# Patient Record
Sex: Female | Born: 1960 | Race: Black or African American | Hispanic: No | Marital: Married | Smoking: Former smoker
Health system: Southern US, Community
[De-identification: ages and names within clinical notes are randomized; demographics above are authoritative.]

## PROBLEM LIST (undated history)

## (undated) DIAGNOSIS — K219 Gastro-esophageal reflux disease without esophagitis: Secondary | ICD-10-CM

## (undated) DIAGNOSIS — R0600 Dyspnea, unspecified: Secondary | ICD-10-CM

## (undated) DIAGNOSIS — D869 Sarcoidosis, unspecified: Secondary | ICD-10-CM

## (undated) HISTORY — PX: OTHER SURGICAL HISTORY: SHX169

## (undated) HISTORY — DX: Gastro-esophageal reflux disease without esophagitis: K21.9

## (undated) HISTORY — DX: Dyspnea, unspecified: R06.00

## (undated) HISTORY — PX: LAPAROSCOPY: SHX197

## (undated) HISTORY — DX: Sarcoidosis, unspecified: D86.9

---

## 2002-09-29 ENCOUNTER — Other Ambulatory Visit: Admission: RE | Admit: 2002-09-29 | Discharge: 2002-09-29 | Payer: Self-pay | Admitting: *Deleted

## 2002-10-12 ENCOUNTER — Encounter: Payer: Self-pay | Admitting: *Deleted

## 2002-10-12 ENCOUNTER — Ambulatory Visit (HOSPITAL_COMMUNITY): Admission: RE | Admit: 2002-10-12 | Discharge: 2002-10-12 | Payer: Self-pay | Admitting: *Deleted

## 2002-11-18 ENCOUNTER — Ambulatory Visit (HOSPITAL_COMMUNITY): Admission: RE | Admit: 2002-11-18 | Discharge: 2002-11-18 | Payer: Self-pay | Admitting: *Deleted

## 2004-11-27 ENCOUNTER — Encounter: Admission: RE | Admit: 2004-11-27 | Discharge: 2004-11-27 | Payer: Self-pay | Admitting: *Deleted

## 2005-11-18 ENCOUNTER — Encounter: Admission: RE | Admit: 2005-11-18 | Discharge: 2005-11-18 | Payer: Self-pay | Admitting: Occupational Medicine

## 2007-05-21 ENCOUNTER — Ambulatory Visit: Payer: Self-pay | Admitting: Internal Medicine

## 2007-07-20 ENCOUNTER — Ambulatory Visit: Payer: Self-pay | Admitting: Internal Medicine

## 2008-06-13 ENCOUNTER — Encounter: Admission: RE | Admit: 2008-06-13 | Discharge: 2008-06-13 | Payer: Self-pay | Admitting: Internal Medicine

## 2008-07-18 DIAGNOSIS — D869 Sarcoidosis, unspecified: Secondary | ICD-10-CM | POA: Insufficient documentation

## 2008-07-18 DIAGNOSIS — R0602 Shortness of breath: Secondary | ICD-10-CM | POA: Insufficient documentation

## 2008-07-19 ENCOUNTER — Ambulatory Visit: Payer: Self-pay | Admitting: Internal Medicine

## 2008-07-19 DIAGNOSIS — K219 Gastro-esophageal reflux disease without esophagitis: Secondary | ICD-10-CM | POA: Insufficient documentation

## 2008-08-14 ENCOUNTER — Ambulatory Visit: Payer: Self-pay | Admitting: Gastroenterology

## 2008-08-21 ENCOUNTER — Ambulatory Visit: Payer: Self-pay | Admitting: Gastroenterology

## 2008-08-21 ENCOUNTER — Encounter: Payer: Self-pay | Admitting: Gastroenterology

## 2008-08-23 ENCOUNTER — Telehealth: Payer: Self-pay | Admitting: Gastroenterology

## 2008-08-23 ENCOUNTER — Encounter: Payer: Self-pay | Admitting: Gastroenterology

## 2008-08-28 ENCOUNTER — Telehealth: Payer: Self-pay | Admitting: Gastroenterology

## 2008-09-21 ENCOUNTER — Ambulatory Visit: Payer: Self-pay | Admitting: Gastroenterology

## 2008-09-21 DIAGNOSIS — K2990 Gastroduodenitis, unspecified, without bleeding: Secondary | ICD-10-CM

## 2008-09-21 DIAGNOSIS — K2970 Gastritis, unspecified, without bleeding: Secondary | ICD-10-CM | POA: Insufficient documentation

## 2008-09-28 ENCOUNTER — Telehealth: Payer: Self-pay | Admitting: Gastroenterology

## 2008-10-02 ENCOUNTER — Encounter: Payer: Self-pay | Admitting: Gastroenterology

## 2009-07-18 ENCOUNTER — Ambulatory Visit: Payer: Self-pay | Admitting: Internal Medicine

## 2009-07-19 ENCOUNTER — Encounter: Admission: RE | Admit: 2009-07-19 | Discharge: 2009-07-19 | Payer: Self-pay | Admitting: Internal Medicine

## 2010-04-10 ENCOUNTER — Telehealth: Payer: Self-pay | Admitting: Internal Medicine

## 2010-07-17 ENCOUNTER — Ambulatory Visit: Payer: Self-pay | Admitting: Internal Medicine

## 2010-10-08 NOTE — Progress Notes (Signed)
Summary: pain while breathing  Phone Note Call from Patient Call back at [phone]   Caller: Patient Call For: young Summary of Call: having pain in her chest when she breathes real deep wants to know if she can get something for this [facility] [address]  Initial call taken by: Lacinda Axon,  April 10, 2010 12:21 PM  Follow-up for Phone Call        Pt c/o dull chest pains with deep breaths x 2 days and non-productive cough. Pt states she has experienced this in the past and was given a prednisone taper and same worked well. Pt states the pain is on the "surface" of her chest. Please advise. Thanks! Zackery Barefoot CMA  April 10, 2010 12:37 PM  Allergies (verified):  1)  ! Penicillin  Additional Follow-up for Phone Call Additional follow up Details #1::        Script for prednisone 10 mg, # 20 1 tab four times daily x 2 days, 3 times daily x 2 days, 2 times daily x 2 days, 1 time daily x 2 days  but suggest she might want to try Advil for a day or two first, as more conservative than the prednisone. Additional Follow-up by: Waymon Budge MD,  April 10, 2010 12:41 PM    Additional Follow-up for Phone Call Additional follow up Details #2::    Pt informed of CY recs. Zackery Barefoot CMA  April 10, 2010 1:05 PM   New/Updated Medications: PREDNISONE 10 MG TABS (PREDNISONE) 1 tab four times daily x 2 days, 3 times daily x 2 days, 2 times daily x 2 days, 1 time daily x 2 days Prescriptions: PREDNISONE 10 MG TABS (PREDNISONE) 1 tab four times daily x 2 days, 3 times daily x 2 days, 2 times daily x 2 days, 1 time daily x 2 days  #20 x 0   Entered by:   Zackery Barefoot CMA   Authorized by:   Waymon Budge MD   Signed by:   Zackery Barefoot CMA on 04/10/2010   Method used:   Electronically to        [facility] [id number] (retail)       [address] [place], [place]  [address]       Ph: [phone]       Fax: [phone]   RxID:    [id number]

## 2010-10-08 NOTE — Assessment & Plan Note (Signed)
Summary: rov 1 yr ///kp   Primary Provider/Referring Provider:  Andi Devon, MD  CC:  Yearly follow up visit-sarcoidosis; When Deep Breathing gets SOB at times..  History of Present Illness: From 07/20/07- HISTORY:  She returns for followup saying that her level of cough is stable.  Advair makes her cough and has not seemed helpful.  She has been aware of dyspnea, relatively new in the past several months.  She has an appointment pending with Dr. Jacinto Duncan for cardiac workup.  She has not had fever or adenopathy or sputum.  IMPRESSION: 1. History of sarcoid which seems burned out. 2. 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 3. Dyspnea, is probably not pulmonary given normal function as     described above.  I am glad she is seeing Dr. Jacinto Duncan and would     wonder about echocardiogram.   07/19/08- Saw Dr Anne Duncan determined LUant chest pain was GERD. Little cough. Denies night sweats, dyspnea, nodes. Admits signif reflux. WASO strangling, not relieved by Prilosec.  July 18, 2009- cough - non prod for 2 weeks - wheezing - denies feverHx Sarcoid, GERD Feels well. Shows me small patch of grainy skin on left hand which has been present 2 weeks. Denies pruritus, fever, sweat, nodes, cough, chest pain, heartburn or malaise. ACE was wnl 21 last year CXR 11/09- interstial prom without nodes, c/w hx sarcoid.  July 17, 2010- cough - non prod for 2 weeks - wheezing - denies fever Nurse-CC: Yearly follow up visit-sarcoidosis; When Deep Breathing gets SOB at times. Minimal occasional short of breath noted lying down, may delay sleep onset. No pattern. Minor cough. Had some rash on face, hand- resolved spontaneously. Denies fever, sweat, glands. Activity unlimited.     Preventive Screening-Counseling & Management  Alcohol-Tobacco     Smoking Status: never  Current Medications (verified): 1)  Tylenol 325 Mg Tabs (Acetaminophen) .... As Needed 2)   Vitamin D 63016 Unit Caps (Ergocalciferol) .... Once Weekly 3)  Aciphex 20 Mg Tbec (Rabeprazole Sodium) .... One Tablet By Mouth Once Daily 4)  Mobic 15 Mg Tabs (Meloxicam) .... Take 1 By Mouth Once Daily  Allergies (verified): 1)  ! Penicillin  Past History:  Past Medical History: Last updated: 08/14/2008 DYSPNEA (ICD-786.05) SARCOIDOSIS (ICD-135) GERD Heart rhythm problems  Past Surgical History: Last updated: 07/18/2009 Tubal ligation and reversal Laparoscopy removal bone spurs right shoulder  Family History: Last updated: 09/12/2008 sister-asthma father-Leukemia Family History of Diabetes: 3 Mat Aunts   Mother- living age 42 Father-deceased age 19; cancer Sibling 1- living age 29; asthma Sibling 2- living age 7 Sibling 3- living age 43 Sibling 93- living age 36  Social History: Last updated: 07/19/2008 Patient never smoked.  Pt is married with no children.  No caffeine use. No ETOH.  Risk Factors: Smoking Status: never (07/17/2010)  Review of Systems      See HPI       The patient complains of shortness of breath at rest and non-productive cough.  The patient denies shortness of breath with activity, productive cough, coughing up blood, chest pain, irregular heartbeats, acid heartburn, indigestion, loss of appetite, weight change, abdominal pain, difficulty swallowing, sore throat, tooth/dental problems, headaches, nasal congestion/difficulty breathing through nose, and sneezing.    Vital Signs:  Patient profile:   50 year old female Height:      66 inches Weight:      131 pounds BMI:  21.22 O2 Sat:      98 % on Room air Pulse rate:   97 / minute BP sitting:   102 / 62  (left arm) Cuff size:   regular  Vitals Entered By: Reynaldo Minium CMA (July 17, 2010 9:37 AM)  O2 Flow:  Room air CC: Yearly follow up visit-sarcoidosis; When Deep Breathing gets SOB at times.   Physical Exam  Additional Exam:  General: A/Ox3; pleasant and cooperative,  NAD, SKIN: 2 cm diam area of rash left hand NODES: no lymphadenopathy HEENT: [place]/AT, EOM- WNL, Conjuctivae- clear, PERRLA, TM-WNL, Nose- clear, Throat- clear and wnl NECK: Supple w/ fair ROM, JVD- none, normal carotid impulses w/o bruits Thyroid-  CHEST: Clear to P&A HEART: RRR, no m/g/r heard ABDOMEN: Soft and nl; nml bowel sounds; no organomegaly or masses noted KZS:WFUX, nl pulses, no edema  NEURO: Grossly intact to observation      Impression & Recommendations:  Problem # 1:  SARCOIDOSIS (ICD-135) Clinically inactive. Discussed common symptoms to watch for.. She will foolow w/ Dr Anne Duncan and RTC here if needed.   Problem # 2:  GERD (ICD-530.81) We also reviewed potential for reflux to impact on lung, and importance of reflux precautions. Her updated medication list for this problem includes:    Aciphex 20 Mg Tbec (Rabeprazole sodium) ..... One tablet by mouth once daily  Other Orders: Est. Patient Level III (32355)  Patient Instructions: 1)  Please schedule a follow-up appointment as needed. 2)  I will be happy to see you again as needed.

## 2011-01-21 NOTE — Assessment & Plan Note (Signed)
[facility]                             PULMONARY OFFICE NOTE   Anne Duncan, Anne Duncan                         MRN:          [id number]  DATE:05/21/2007                            DOB:          [birth year]    PROBLEM:  Dyspnea with history of sarcoid.   HISTORY:  I diagnosed this 50 year old African-American woman with  sarcoidosis in 1992 by bronchoscopy after abnormal chest x-ray and she  was treated then with prednisone.  I do not have records to detail how  long she was treated.  In 2005 she was seen by Dr. Marcos Eke for problems  of cough complicated by esophageal reflux which was described as fairly  severe at that time and treated with Aciphex.  She now describes  increased shortness of breath mainly with exertion over the past two  months.  She says she also wakes at night short of breath.  She coughs  especially at night nonproductively, but with a sense of chest tightness  and occasional palpitation.  She says night sweats are chronic and  stable.  Advair has helped her.   MEDICATIONS:  1. Vitamin D.  2. Advair 100/50 b.i.d.  3. Tylenol p.r.n.   ALLERGIES:  PENICILLIN.   She no longer takes Aciphex or any acid blocker.   REVIEW OF SYSTEMS:  Occasionally feels numbness in right arm and right  leg.  No real pattern.  Actually more of a chest tightness.  Occasional  palpitation.  Acid indigestion.  Headaches.  Joint stiffness.  Weight  has been stable.  She denies adenopathy, blood, or rash.   PAST MEDICAL HISTORY:  Heart rhythm problems.  Sarcoid treated with  prednisone.  Tubal ligation and reversal.  She denies being pregnant.  No exposure to tuberculosis.   SOCIAL HISTORY:  Never smoked.  Married.  Working as a Water engineer  at [employer].  No children.   FAMILY HISTORY:  She denies anybody with respiratory problems or  sarcoid.   OBJECTIVE:  VITAL SIGNS:  Weight 118 pounds, blood pressure 106/70,  pulse 76, room air  saturation 100%.  GENERAL:  This is a slim woman in no evident distress.  SKIN:  Without obvious rash.  No adenopathy found.  HEENT:  Conjunctivae and oral mucosa clear.  Nose is not obstructed.  CHEST:  Quiet, clear breath sounds.  No wheeze, rales, or rhonchi.  HEART:  Sounds are regular without murmur or gallop.  ABDOMEN:  Without hepatosplenomegaly.  EXTREMITIES:  Without cyanosis or clubbing.   IMPRESSION:  Remote history of sarcoid diagnosed 16 years ago and  treated then with prednisone.  Now with constitutional symptoms which  might reflect reactivation of sarcoid or something else.   PLAN:  1. Blood for ACE level.  2. Chest x-ray.  3. Pulmonary function tests.  4. She will continue her Advair until she returns.   I appreciate the chance to see her again and we certainly will try to  find what is making her uncomfortable.     Clinton D. Maple Hudson, MD, FCCP, FACP  Electronically Signed  CDY/MedQ  DD: 05/23/2007  DT: 05/23/2007  Job #: [id number]   cc:   Merlene Laughter. Renae Gloss, M.D.

## 2011-01-21 NOTE — Assessment & Plan Note (Signed)
[facility]                             PULMONARY OFFICE NOTE   NAME:CATOELadye, Macnaughton                         MRN:          [id number]  DATE:07/20/2007                            DOB:          [birth year]    1. History of sarcoidosis.  2. Dyspnea.   HISTORY:  She returns for followup saying that her level of cough is  stable.  Advair makes her cough and has not seemed helpful.  She has  been aware of dyspnea, relatively new in the past several months.  She  has an appointment pending with Dr. Jacinto Halim for cardiac workup.  She has  not had fever or adenopathy or sputum.   MEDICATIONS:  1. Vitamin D.  2. Advair 100/50.  3. Occasional Tylenol.   DRUG INTOLERANT PENICILLIN.   OBJECTIVE:  Weight 122 pounds, blood pressure 114/74, pulse 82, room air  saturation 100%.  Dry cough, no distress, slender.  I find no  adenopathy, neck vein distention, postnasal drip or stridor.  HEART:  Sounds are regular without murmur heard.   Chest x-ray September 12 described stable exam with diffuse interstitial  prominence, not changed since January 2004.  Hilar prominence stable,  these are consistent with previous sarcoid.  Pulmonary function testing  today showed mild restriction with a total lung capacity 75% of  predicted.  Flows were normal for volume with no response to  bronchodilator.  Diffusion was normal.  On a 6-minute walk test she went  402 meters.  She was transiently dizzy on completing the exercise.  Blood pressure rose from 114/74 to 116/78 and 2 minutes later was  110/72, heart rate rose from 82 to 92 and two minutes later was 90,  oxygen saturation stayed 100% - 99% during the exercise and two minutes  after ending was 97%.  Normal cardiopulmonary function.  Exercise limit  was not reached with this exercise.   IMPRESSION:  1. History of sarcoid which seems burned out.  2. Dry cough is nonspecific but could reflect sarcoid granulomas in      the  airways.  3. Dyspnea, is probably not pulmonary given normal function as      described above.  I am glad she is seeing Dr. Jacinto Halim and would      wonder about echocardiogram.   PLAN:  1. Since today's PFTs were obtained after she had taken Advair we      recognized possibility that at times she may be worse.  She is      going to try stopping Advair for 1 week and then restarting to see      if she can tell any benefit.  2. Agree with cardiac workup for dyspnea including echocardiogram.  3. She is encouraged to walk for aerobic exercise.  4. Flu vaccine.  5. Schedule return 1 year, but earlier p.r.n.     Clinton D. Maple Hudson, MD, Tonny Bollman, FACP  Electronically Signed    CDY/MedQ  DD: 07/20/2007  DT: 07/21/2007  Job #: [id number]   cc:   Merlene Laughter. Renae Gloss, M.D.  Cristy Hilts.  Jacinto Halim, MD

## 2011-01-24 NOTE — H&P (Signed)
   NAME:  Anne Duncan, Anne Duncan                             ACCOUNT NO.:  [id number]   MEDICAL RECORD NO.:  [id number]                   PATIENT TYPE:  AMB   LOCATION:  [facility]                                  FACILITY:  [facility]   PHYSICIAN:  [place] B. Earlene Plater, M.D.               DATE OF BIRTH:  March 02, 1961   DATE OF ADMISSION:  DATE OF DISCHARGE:                                HISTORY & PHYSICAL   CHIEF COMPLAINT:  Infertility and hydrosalpinx.   INTENDED PROCEDURE:  Operative laparoscopy, left salpingectomy, possible  right fimbrioplasty, and lysis of adhesions.   HISTORY OF PRESENT ILLNESS:  A 50 year old African-American female gravida 0  with long history of infertility.  A recent HSG showed a left hydrosalpinx  and right tubal blockage in the isthmic portion.  Has previously used Clomid  for two to three rounds and has had no other identified infertility factors.  Is interested in operative management to optimize chances at spontaneous  conception or in vitro fertilization.   PAST MEDICAL HISTORY:  Chicken pox.  No known history of PID or cervicitis.   SURGICAL HISTORY:  None.   MEDICATIONS:  Multivitamin.   ALLERGIES:  PENICILLIN causes hives.   SOCIAL HISTORY:  No alcohol, tobacco, or other drugs.  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   FAMILY HISTORY:  Hypertension and a maternal uncle and father with leukemia.   REVIEW OF SYSTEMS:  Otherwise significant only for heartburn.   PHYSICAL EXAMINATION:  VITAL SIGNS:  Blood pressure 100/70, weight 115,  height 5 feet 5 inches.  GENERAL:  Alert and oriented, no acute distress.  SKIN:  Warm and dry, no lesions.  NECK:  Supple.  No thyromegaly.  HEART:  Regular rate and rhythm.  LUNGS:  Clear to auscultation.  ABDOMEN:  Liver and spleen normal, no hernia.  LYMPH NODE SURVEY:  Negative at neck, axilla, and groin.  BREASTS:  No dominant masses, nipple discharge, or adenopathy.  PELVIC:  Normal external  genitalia, vagina and cervix normal.  Recent Pap  normal.  No adnexal masses palpable.   ASSESSMENT:  Infertility, tubal factor.    PLAN:  Left salpingectomy, possible right tuboplasty, lysis of adhesions,  treatment of any other pelvic factors identified that might contribute to  infertility.  Operative risks discussed including infection, bleeding,  damage to bowel, bladder, or surrounding organs.  All questions answered.  The patient wishes to proceed.                                               Gerri Spore B. Earlene Plater, M.D.    WBD/MEDQ  D:  11/14/2002  T:  11/14/2002  Job:  [id number]

## 2011-01-24 NOTE — Op Note (Signed)
NAME:  Anne Duncan, Anne Duncan                             ACCOUNT NO.:  [id number]   MEDICAL RECORD NO.:  [id number]                   PATIENT TYPE:  AMB   LOCATION:  [facility]                                  FACILITY:  [facility]   PHYSICIAN:  [place] B. Earlene Plater, M.D.               DATE OF BIRTH:  Dec 16, 1960   DATE OF PROCEDURE:  11/18/2002  DATE OF DISCHARGE:                                 OPERATIVE REPORT   PREOPERATIVE DIAGNOSES:  1. Infertility.  2. Left hydrosalpinx.   POSTOPERATIVE DIAGNOSES:  1. Infertility.  2. Left and right hydrosalpinx.   PROCEDURES:  1. Open laparoscopy.  2. Lysis of adhesions.   SURGEON:  Chester Holstein. Earlene Plater, M.D.   ANESTHESIA:  General.   FINDINGS:  Right hydrosalpinx with dense adhesions to the right ovary.  Left  hydrosalpinx with dense adhesions to the sigmoid colon.  Neither of these  were deemed resectable through the laparoscope.  One 3 mm questionable  endometriosis implant along the left uterosacral ligament, filmy adhesions  within the cul-de-sac posteriorly.  Normal-appearing upper abdomen.  Jejunal  adhesions to the right pelvic sidewall.   ESTIMATED BLOOD LOSS:  50 mL.   FLUIDS REPLACED:  1200 mL.   URINE OUTPUT:  100 mL.   COMPLICATIONS:  None.   INDICATIONS:  Patient with a history of infertility, hysterosalpingogram  preoperatively indicating left hydrosalpinx and right tubal obstruction.  Patient with no history of PID or endometriosis.   DESCRIPTION OF PROCEDURE:  The patient was taken to the operating room and  general anesthesia obtained.  She was placed in the ski position and prepped  and draped in the standard fashion.  A Foley catheter inserted into the  bladder and the Corson cannula inserted after a single-tooth tenaculum  attached to the cervix.   A vertical incision made with a knife at the umbilicus and carried sharply  through the fascia.  A pursestring suture of 0 Vicryl placed around the  fascial defect, Hasson cannula inserted  and secured.  Pneumoperitoneum  obtained with CO2 gas, Trendelenburg position obtained.   Five millimeter trocars placed 2 cm above the symphysis in the midline and  the left lower quadrant, each under direct laparoscopic visualization.  Bowel mobilized superiorly.  The above findings were noted.   The right tube and ovary were densely adherent to one another to the point  that the right hydrosalpinx could not be resected without removal of the  ovary.  Therefore, it was left in place.   On the left side the left tube was densely adherent to the sigmoid colon and  for similar reasons was left in situ.  In order to visualize the left tube,  the adhesions from the sigmoid colon to the left pelvic sidewall were taken  down sharply.  At that point it was clear that neither tube could be safely  treated through the laparoscope,  and therefore the procedure was terminated.   The inferior ports were removed and their sites hemostatic.  The scope  removed, the pressure released, the Hasson cannula removed, the abdominal  wall elevated with the [employer] retractor, and the fascial defect closed.  No intra-abdominal contents herniated through prior to closure.  Skin closed  at each site with subcuticular 4-0 Vicryl.  On closing the umbilicus, the  initial fascial suture was cut.  Therefore, the abdominal wall was re-  elevated with Kocher clamps and pursestring suture replaced around the  fascial defect.  While the abdominal wall was elevated, the suture was  snugged down.  This obliterated the fascial defect, and no intra-abdominal  contents herniated through prior to closure.  The skin was then re-closed at  the umbilicus with 4-0 Vicryl in subcuticular fashion.  Inferior ports  closed in similar fashion.   The patient tolerated the procedure well.  There were no complications.  She  was taken to the recovery room awake, alert, and in stable condition.                                                Gerri Spore B. Earlene Plater, M.D.    WBD/MEDQ  D:  11/18/2002  T:  11/18/2002  Job:  [id number]

## 2011-02-28 ENCOUNTER — Encounter (HOSPITAL_COMMUNITY)
Admission: RE | Admit: 2011-02-28 | Discharge: 2011-02-28 | Disposition: A | Discharge status: Home / Self Care | Payer: BC Managed Care – PPO | Source: Ambulatory Visit | Attending: Obstetrics and Gynecology | Admitting: Obstetrics and Gynecology

## 2011-02-28 LAB — CBC
HCT: 37.3 % (ref 36.0–46.0)
MCHC: 32.4 g/dL (ref 30.0–36.0)
MCV: 89.2 fL (ref 78.0–100.0)
RDW: 13 % (ref 11.5–15.5)

## 2011-03-07 ENCOUNTER — Other Ambulatory Visit: Payer: Self-pay | Admitting: Obstetrics and Gynecology

## 2011-03-07 ENCOUNTER — Ambulatory Visit (HOSPITAL_COMMUNITY)
Admission: EM | Admit: 2011-03-07 | Discharge: 2011-03-07 | Disposition: A | Discharge status: Home / Self Care | Payer: BC Managed Care – PPO | Source: Ambulatory Visit | Attending: Obstetrics and Gynecology | Admitting: Obstetrics and Gynecology

## 2011-03-07 DIAGNOSIS — Z01812 Encounter for preprocedural laboratory examination: Secondary | ICD-10-CM | POA: Insufficient documentation

## 2011-03-07 DIAGNOSIS — N831 Corpus luteum cyst of ovary, unspecified side: Secondary | ICD-10-CM | POA: Insufficient documentation

## 2011-03-07 DIAGNOSIS — N83209 Unspecified ovarian cyst, unspecified side: Secondary | ICD-10-CM | POA: Insufficient documentation

## 2011-03-07 DIAGNOSIS — N920 Excessive and frequent menstruation with regular cycle: Secondary | ICD-10-CM | POA: Insufficient documentation

## 2011-03-07 DIAGNOSIS — N840 Polyp of corpus uteri: Secondary | ICD-10-CM | POA: Insufficient documentation

## 2011-03-07 DIAGNOSIS — Z01818 Encounter for other preprocedural examination: Secondary | ICD-10-CM | POA: Insufficient documentation

## 2011-03-12 NOTE — Consult Note (Signed)
NAMEALVARETTA, EISENBERGER                ACCOUNT NO.:  [id number]  MEDICAL RECORD NO.:  [id number]  LOCATION:  [facility]                          [facility]  PHYSICIAN:  Quran Vasco A. Yeray Tomas, M.D. DATE OF BIRTH:  May 03, 1961  DATE OF CONSULTATION: DATE OF DISCHARGE:                                CONSULTATION   PREOPERATIVE DIAGNOSIS:  Persistent complex left ovarian cyst menorrhagia.  POSTOPERATIVE DIAGNOSES:  Persistent complex left ovarian cyst menorrhagia with right ovarian cyst, dense pelvic adhesions, endometriosis or stigmata consistent with endometriosis, and endometrial polyp.  PROCEDURES:  Laparoscopic bilateral ovarian cystectomies, lysis of adhesions, peritoneal biopsy, pelvic washings, dilation and curettage, hysteroscopy, and polypectomy.  SURGEON:  Debraann Livingstone A. Normand Sloop, MD  ASSISTANT:  Marquis Lunch. Adline Peals.  ANESTHESIA:  General and local.  FINDINGS:  Small polyp near the right cornea of the uterus, otherwise atrophic-appearing endometrium, and no submucosal fibroids.  The findings abdominally are bilateral ovarian cysts, one on the right, one on the left, one on the patient's left side was about 3-4 cm, one on the right side was a good 6 cm.  What we sent to pathology was right and left ovarian cyst walls and posterior peritoneal biopsy or sidewall biopsy and pelvic washings.  ESTIMATED BLOOD LOSS:  75 mL.  URINE OUTPUT:  100 mL.  IV FLUIDS:  2000 mL of crystalloid.  There were no complications.  The patient went to recovery in stable condition.  PROCEDURE IN DETAIL:  The patient was taken to the operating room, given general anesthesia, placed in dorsal lithotomy position, and prepped and draped in the normal sterile fashion.  A bivalve speculum was placed into the vagina.  The anterior lip of the cervix was grasped with a single tooth tenaculum, 20 mL of 1% Xylocaine was used for cervical block.  Cervix was then dilated with Shawnie Pons dilators of 21.   The hysteroscope was placed into the uterine cavity.  One polyp was seen near the right cornu.  Both ostia were seen.  No submucosal fibroids or the polyps were seen.  The cervix further dilated with Pratt dilators up to 31.  The resectoscope was placed and not using any heat, just the loop I have removed the polyp.  I then did a D and C and endometrial curettings.  I then looked again and there were no signs of perforation. The polyp was removed in its entirety.  The hysteroscope was then removed from the uterus and cervix.  The acorn manipulator was placed into the cervix and attached to the tenaculum already in place. Attention was then turned to the umbilicus where Marcaine was placed around the umbilicus for general anesthesia.  A 10-mm infraumbilical incision was made with a scalpel and carried down to the fascia.  Fascia was then incised in midline, extended bilateral with Mayo scissors.  The peritoneum was identified, tented up, and entered bluntly.  The fascia was circumscribed with 0 Vicryl and the Hasson was placed into the abdominal cavity and the balloon was insufflated.  Upon inspection, the abdominal anatomy appeared to be normal.  Liver was normal.  Appendix was normal.  The patient had left ovarian, bowel, and sidewall adhesions.  She had bilateral hydrosalpinges.  She had a left ovarian cyst consistent with endometrioma.  She has a small fibroid anteriorly on the uterus and the anterior cul-de-sac though was clear.  The posterior cul-de-sac with filmy adhesions and along the left side wall with powder burn lesion consistent with endometriosis.  On the right side again, bilateral hydrosalpinx, we could not to see the fimbria clear.  It was about a 6 cm ovarian cyst on that side.  So, attention was turned to the left side first where the adhesions were both sharply and bluntly dissected from the bowel and the tube and ovary.  There was no injury that could occur to the  bowel, the tube, and ovary.  Also, the bowel was adherent to the left side wall, and these adhesions were also lysed.  The ovary could now be seen and using the monopolar scissors, a small linear incision was made.  The cyst was identified.  The cyst did pop and there was blood seen in the cyst wall.  The cyst wall was then removed from the ovary and any bleeding areas were made hemostatic using Kleppinger.  Aeration was then done.  Hemostasis was assured.  Attention was then turned to the left sidewall.  The peritoneum was insufflated with hydrodissection with water and little stigmata of endometriosis was removed.  There was some bleeding.  I tried to leave it alone for some time.  We tried to locate the ureter, but could not see it.  I did infiltrate or open the peritoneum and could not see the ureter underneath, so I did use Kleppinger to burn that area.  Hemostasis was assured and no injury occurred to any pelvic wall structures.  In the posterior cul-de-sac, there were filmy adhesions, which were lysed and removed.  Then, the patient's right utero-ovarian ligament was grasped to mobilize the ovary.  A linear incision was made with the unipolar scissors and then opened the cyst, and it was clear straw fluid.  The cyst wall was removed with traction and countertraction without difficulty.  The ovarian bed was made hemostatic with Kleppinger without difficulty.  Irrigation was done.  Hemostasis was assured.  There was a little bit of bleeding along where we did the dissection on the left side from the __________ to the tube and the bowel was right underneath this fatty area, did not want to use any heat, so what we did was just hold pressure and used Gelfoam and let the air out of the abdomen and re- insufflated and was hemostatic.  All instruments at this point were removed from the abdomen under direct visualization of the laparoscope. The fascia was reapproximated at the umbilicus.   The skin was closed with 3-0 Monocryl in a subcuticular fashion.  As she requested in the beginning of the procedure, two small incisions were made with the needle in the right and left lower quadrants and two 5-mm trocars were placed under direct visualization of the laparoscope, so these were removed.  Hemostasis was assured.  Dermabond was placed.  I then removed the tenaculum and the acorn.  She had some bleeding from tenaculum site on the cervix, which was made hemostatic with Bovie cautery.  I removed the Foley, which was placed before even the hysteroscopy was done. Sponge, lap, and needle counts were correct.  The patient was sent to recovery in stable condition.     Robbyn Hodkinson A. Normand Sloop, M.D.     NAD/MEDQ  D:  03/07/2011  T:  03/08/2011  Job:  [id number]  Electronically Signed by Jaymes Graff M.D. on 03/12/2011 01:06:03 PM

## 2011-03-17 NOTE — H&P (Signed)
NAMEYURITZA, PAULHUS                ACCOUNT NO.:  [id number]  MEDICAL RECORD NO.:  [id number]  LOCATION:  [facility]                          [facility]  PHYSICIAN:  Naima A. Dillard, M.D. DATE OF BIRTH:  November 12, 1960  DATE OF ADMISSION:  03/07/2011 DATE OF DISCHARGE:  03/07/2011                             HISTORY & PHYSICAL   HISTORY OF PRESENT ILLNESS:  Anne Duncan is a 50 year old married black female, gravida 0 presenting for hysteroscopy, D&C with resection of an endometrial polyp along with laparoscopic ovarian cystectomy because of menorrhagia, symptomatic uterine fibroids, endometrial polyp, and a complex ovarian cyst.  Anne Duncan was amenorrheic for 6 months and then for 5 consecutive months, had irregular menstrual period.  Following that time, she experiences 7-day menstrual flow requiring her to change the tampon every 1-2 hours along with severe cramping that radiated to her back and legs and accompanied by nausea.  The patient underwent an endometrial biopsy in May 2012 that showed secretory endometrium with no hyperplasia, atypia, or malignancy.  A pelvic ultrasound in May 2012 showed a uterus measuring 7.84 x 4.98 x 3.10 cm and a sonohysterogram with saline infusion showing an endometrial mass measuring 0.90 x 0.65 x 0.72 cm.  Additionally, the patient was found to have a right ovarian cyst measuring 5.44 x 3.42 x 4.98 cm and a right lateral subserosal fibroid measuring 2.14 x 2.07 x 2.42 cm.  There was no free fluid noted and no pelvic masses seen.  The patient's left ovary measured 2.64 x 4.06 x 1.87 cm and right ovary 5.44 x 4.98 x 3.42 cm.  Given the patient's ultrasound findings and her corresponding symptomatology, she has consented to proceed with surgical management.  PAST MEDICAL HISTORY:  OB history:  Gravida 0.   GYN history:  Menarche 50 years old.  Her last menstrual period was October 20, 2010.  Shedoes not use any contraception.  Denies any history of  sexually transmitted diseases or abnormal Pap smears.  Her last normal Pap smear was November 2011.    Medical history is positive for chronic shoulder pain.  SURGICAL HISTORY:  In 2005, she underwent laparoscopy for pelvic pain.  FAMILY HISTORY:  Positive for asthma and hypertension.  SOCIAL HISTORY:  The patient is married and she works as a Mining engineer. Habits:  She does not use alcohol, tobacco, or illicit drugs.  CURRENT MEDICATIONS:  Vitamin D.  She is allergic to PENICILLIN.  REVIEW OF SYSTEMS:  Negative except as is mentioned in history of present illness.  PHYSICAL EXAMINATION:  VITAL SIGNS:  Blood pressure 110/70, weight 133 pounds, height 5 feet 6-1/2 inches tall, body mass index 21. NECK:  Supple without masses.  There is no thyromegaly or cervical adenopathy. HEART:  Regular rate and rhythm. LUNGS:  Clear. BACK:  No CVA tenderness. ABDOMEN:  Without tenderness, guarding, rebound, or organomegaly. PELVIC:  EGBUS is normal.  Vagina is normal.  Cervix is nontender without lesions.  Uterus appears normal size, shape, and consistency. Adnexa without tenderness or masses.  IMPRESSION: 1. Menorrhagia. 2. Persistent right ovarian cyst (complex).  DISPOSITION:  A discussion was held with the patient regarding the indications for her  procedures along with their risks which include but are not limited to reaction to anesthesia, damage to adjacent organs, infection, excessive bleeding, and endometrial scarring.  The patient verbalized understanding of these risk and consented to proceed with an operative laparoscopy with ovarian cystectomy, hysteroscopy, D and C with resection of endometrial polyp at [facility] on March 07, 2011.     Anne Duncan, P.A.-C   ______________________________ Anne Duncan, M.D.    EJP/MEDQ  D:  03/17/2011  T:  03/17/2011  Job:  [id number]

## 2011-04-18 ENCOUNTER — Other Ambulatory Visit: Payer: Self-pay | Admitting: Internal Medicine

## 2011-04-18 DIAGNOSIS — Z1231 Encounter for screening mammogram for malignant neoplasm of breast: Secondary | ICD-10-CM

## 2011-04-23 ENCOUNTER — Ambulatory Visit
Admission: RE | Admit: 2011-04-23 | Discharge: 2011-04-23 | Disposition: A | Discharge status: Home / Self Care | Payer: BC Managed Care – PPO | Source: Ambulatory Visit | Attending: Internal Medicine | Admitting: Internal Medicine

## 2011-04-23 DIAGNOSIS — Z1231 Encounter for screening mammogram for malignant neoplasm of breast: Secondary | ICD-10-CM

## 2011-04-26 ENCOUNTER — Ambulatory Visit (INDEPENDENT_AMBULATORY_CARE_PROVIDER_SITE_OTHER): Payer: BC Managed Care – PPO

## 2011-04-26 ENCOUNTER — Inpatient Hospital Stay (INDEPENDENT_AMBULATORY_CARE_PROVIDER_SITE_OTHER)
Admission: RE | Admit: 2011-04-26 | Discharge: 2011-04-26 | Disposition: A | Discharge status: Home / Self Care | Payer: BC Managed Care – PPO | Source: Ambulatory Visit | Attending: Emergency Medicine | Admitting: Emergency Medicine

## 2011-04-26 DIAGNOSIS — R1013 Epigastric pain: Secondary | ICD-10-CM

## 2011-04-26 DIAGNOSIS — R51 Headache: Secondary | ICD-10-CM

## 2011-04-26 LAB — CBC
MCH: 28.4 pg (ref 26.0–34.0)
MCV: 86.5 fL (ref 78.0–100.0)
Platelets: 295 10*3/uL (ref 150–400)
RBC: 4.29 MIL/uL (ref 3.87–5.11)

## 2011-04-26 LAB — POCT I-STAT, CHEM 8
BUN: 6 mg/dL (ref 6–23)
Calcium, Ion: 1.15 mmol/L (ref 1.12–1.32)
Chloride: 105 mEq/L (ref 96–112)
Creatinine, Ser: 0.9 mg/dL (ref 0.50–1.10)
Glucose, Bld: 100 mg/dL — ABNORMAL HIGH (ref 70–99)

## 2011-04-26 LAB — DIFFERENTIAL
Eosinophils Absolute: 0 10*3/uL (ref 0.0–0.7)
Eosinophils Relative: 0 % (ref 0–5)
Lymphs Abs: 2.8 10*3/uL (ref 0.7–4.0)
Monocytes Relative: 6 % (ref 3–12)
Neutrophils Relative %: 50 % (ref 43–77)

## 2011-05-02 ENCOUNTER — Ambulatory Visit (INDEPENDENT_AMBULATORY_CARE_PROVIDER_SITE_OTHER): Payer: BC Managed Care – PPO | Admitting: Internal Medicine

## 2011-05-02 ENCOUNTER — Encounter: Payer: Self-pay | Admitting: Internal Medicine

## 2011-05-02 VITALS — BP 110/64 | HR 69 | Ht 66.0 in | Wt 133.8 lb

## 2011-05-02 DIAGNOSIS — D869 Sarcoidosis, unspecified: Secondary | ICD-10-CM

## 2011-05-02 DIAGNOSIS — K219 Gastro-esophageal reflux disease without esophagitis: Secondary | ICD-10-CM

## 2011-05-02 NOTE — Progress Notes (Signed)
Subjective:    Patient ID: Anne Duncan, female    DOB: 03-17-1961, 50 y.o.   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  Now 1 week ago as she got up for work, she noted substernal pressure sense without wheeze, dyspnea, or GI symptoms. This lasted through the day, worse with acitivity, walking. Next day had some fever and chillling that faded by night. She went to Urgent Care - CXR "spot", bloodwork "fine". Gave tylenol w/ codiene -relieved discomfort. Gave prilosec for ? reason- made her nauseated.  CXR- 04/26/11- diffuse increased markings c/w sarcoid, stable RUL nodule c/w 2009.  She now feels well.  We reviewed CXR images together back to 2008. Interstitial markings may be a little denser. We think we can see the peripheral RUL nodule back that far, obscured by rib.    Review of Systems Constitutional:   No- current  weight loss, night sweats, fevers, chills, fatigue, lassitude. HEENT:   No-  headaches, difficulty swallowing, tooth/dental problems, sore throat,       No-  sneezing, itching, ear ache, nasal congestion, post nasal drip,  CV:  No-   chest pain, orthopnea, PND, swelling in lower extremities, anasarca, dizziness, palpitations Resp: No-   shortness of breath with exertion or at rest.              No-   productive cough,  No non-productive cough,  No-  coughing up of blood.              No-   change in color of mucus.  No- wheezing.   Skin: No-   rash or lesions. GI:  No-   heartburn, indigestion, abdominal pain, nausea, vomiting, diarrhea,                 change in bowel habits, loss of appetite GU: No-   dysuria, change in color of urine, no urgency or frequency.  No- flank pain. MS:  No-   joint pain or swelling.  No- decreased range of motion.  No- back pain. Neuro- grossly normal to observation, Or:  Psych:  No- change in mood or  affect. No depression or anxiety.  No memory loss.     Objective:   Physical Exam General- Alert, Oriented, Affect-appropriate, Distress- none acute  WDWN Skin- rash-none, lesions- none, excoriation- none Lymphadenopathy- none Head- atraumatic            Eyes- Gross vision intact, PERRLA, conjunctivae clear secretions            Ears- Hearing, canals normal            Nose- Clear, no-Septal dev, mucus, polyps, erosion, perforation   Sniffing lightly            Throat- Mallampati III , mucosa clear , drainage- none, tonsils- atrophic Neck- flexible , trachea midline, no stridor , thyroid nl, carotid no bruit Chest - symmetrical excursion , unlabored           Heart/CV- RRR , no murmur , no gallop  , no rub, nl s1 s2                           - JVD- none , edema- none, stasis changes- none, varices- none           Lung- clear to  P&A, wheeze- none, cough- none , dullness-none, rub- none           Chest wall-  Abd- tender-no, distended-no, bowel sounds-present, HSM- no Br/ Gen/ Rectal- Not done, not indicated Extrem- cyanosis- none, clubbing, none, atrophy- none, strength- nl Neuro- grossly intact to observation         Assessment & Plan:

## 2011-05-02 NOTE — Patient Instructions (Signed)
Please call as needed 

## 2011-05-02 NOTE — Assessment & Plan Note (Signed)
Recent transient bronchitis syndrome, but now feels well. She is satisfied and we have no evidence of ongoing process so we will see her back prn. Considered ACE level and decided to defer for now.

## 2011-05-06 NOTE — Assessment & Plan Note (Signed)
Reflux precautions were reviewed. We discussed the possibility that her substernal discomfort was esophagitis as this has resolved she will follow

## 2011-08-11 ENCOUNTER — Telehealth: Payer: Self-pay | Admitting: Internal Medicine

## 2011-08-11 MED ORDER — PREDNISONE 10 MG PO TABS: ORAL_TABLET | ORAL | Status: DC

## 2011-08-11 NOTE — Telephone Encounter (Signed)
Per CY-for wheeze cough chest tight offer her prednisone 10mg  #20 take 4 x 2 days, 3 x 2 days, 2 x 2 days, 1 x 2 days no refills.

## 2011-08-11 NOTE — Telephone Encounter (Signed)
I spoke with pt and is aware of cdy recs. Pt aware also of rx directions and rx was sent to pharmacy. Pt voiced hr understanding and had no question

## 2011-08-11 NOTE — Telephone Encounter (Signed)
Spoke with pt. She is c/o SOB and non prod cough x 2 days. She states that her chest feels tight also. She states that she was wheezing some last night and this am. No fever. Would like something called to pharm. Please advise, thanks! Allergies  Allergen Reactions  . Penicillins     REACTION: intolerant

## 2012-06-07 ENCOUNTER — Other Ambulatory Visit: Payer: Self-pay | Admitting: Internal Medicine

## 2012-06-07 DIAGNOSIS — Z1231 Encounter for screening mammogram for malignant neoplasm of breast: Secondary | ICD-10-CM

## 2012-07-05 ENCOUNTER — Encounter (HOSPITAL_COMMUNITY): Payer: Self-pay | Admitting: *Deleted

## 2012-07-05 ENCOUNTER — Emergency Department (HOSPITAL_COMMUNITY)
Admission: EM | Admit: 2012-07-05 | Discharge: 2012-07-05 | Disposition: A | Discharge status: Home / Self Care | Payer: BC Managed Care – PPO | Source: Home / Self Care

## 2012-07-05 DIAGNOSIS — M940 Chondrocostal junction syndrome [Tietze]: Secondary | ICD-10-CM

## 2012-07-05 MED ORDER — KETOROLAC TROMETHAMINE 60 MG/2ML IM SOLN
60.0000 mg | Freq: Once | INTRAMUSCULAR | Status: AC
  Administered 2012-07-05: 60 mg via INTRAMUSCULAR

## 2012-07-05 MED ORDER — NAPROXEN 500 MG PO TBEC: 500.0000 mg | DELAYED_RELEASE_TABLET | Freq: Two times a day (BID) | ORAL | Status: DC

## 2012-07-05 MED ORDER — KETOROLAC TROMETHAMINE 60 MG/2ML IM SOLN
INTRAMUSCULAR | Status: AC
  Filled 2012-07-05: qty 2

## 2012-07-05 NOTE — ED Provider Notes (Signed)
History     CSN: [id number]  Arrival date & time 07/05/12  0805   None     Chief Complaint  Patient presents with  . Flank Pain    (Consider location/radiation/quality/duration/timing/severity/associated sxs/prior treatment) HPI Comments: 51 year old female who is complaining of right chest wall pain for one week. Pain is located along the ribs beneath the breast including the xiphoid and right lateral ribs. She states the pain is worse with movement rotation and twisting of the torso and lying down the right side. It also hurts with a deep breath. She denies fever chills shortness of breath or cough. She also denies soft tissue pain or tenderness of the breast. She denies any known injury or event that would've caused the pain. 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 She denies heaviness, tightness, fullness, pressure, or squeezing discomfort of the chest. Denies diaphoresis or GI symptoms.   Past Medical History  Diagnosis Date  . Dyspnea   . Sarcoidosis   . GERD (gastroesophageal reflux disease)     Past Surgical History  Procedure Date  . Tubal ligation and reversal   . Laparoscopy   . Removal bone spurs right shoulder     History reviewed. No pertinent family history.  History  Substance Use Topics  . Smoking status: Never Smoker   . Smokeless tobacco: Not on file  . Alcohol Use: No    OB History    Grav Para Term Preterm Abortions TAB SAB Ect Mult Living                  Review of Systems  Constitutional: Negative for fever, chills and activity change.  HENT: Negative.   Respiratory: Negative.  Negative for chest tightness, shortness of breath and wheezing.   Cardiovascular: Negative.   Genitourinary: Negative for dysuria, frequency, flank pain and pelvic pain.  Musculoskeletal: Negative for joint swelling and gait problem.       As per HPI  Skin: Negative for color change, pallor and rash.  Neurological: Negative.    Psychiatric/Behavioral: Negative.     Allergies  Penicillins  Home Medications   Current Outpatient Rx  Name Route Sig Dispense Refill  . ERGOCALCIFEROL 50000 UNITS PO CAPS Oral Take 50,000 Units by mouth once a week.      Marland Kitchen NAPROXEN 500 MG PO TBEC Oral Take 1 tablet (500 mg total) by mouth 2 (two) times daily with a meal. 24 tablet 0  . PREDNISONE 10 MG PO TABS  Take 4 tablets x 2 days, 3 tablets x 2 days, 2 tablets x 2 days, 1 tablet x 2 days, then stop 20 tablet 0    BP 138/0  Pulse 72  Temp 98.6 F (37 C)  Resp 16  SpO2 100%  Physical Exam  Constitutional: She is oriented to person, place, and time. She appears well-developed and well-nourished. No distress.  HENT:  Head: Normocephalic and atraumatic.  Eyes: EOM are normal. Pupils are equal, round, and reactive to light.  Neck: Normal range of motion. Neck supple.  Cardiovascular: Normal rate, regular rhythm and normal heart sounds.   Pulmonary/Chest: Effort normal and breath sounds normal. No respiratory distress. She has no wheezes. She has no rales.       Exquisite, reproducible chest wall tenderness along the right costal margin and ribs beneath the right breast and right lateral chest wall. It was reproduced with minimal pressure in patient's voluntary movements.  Abdominal: Soft. She  exhibits no distension and no mass. There is no tenderness. There is no rebound and no guarding.  Musculoskeletal: Normal range of motion. She exhibits tenderness. She exhibits no edema.  Lymphadenopathy:    She has no cervical adenopathy.  Neurological: She is alert and oriented to person, place, and time. No cranial nerve deficit.  Skin: Skin is warm and dry.    ED Course  Procedures (including critical care time)  Labs Reviewed - No data to display No results found.   1. Costochondritis, acute       MDM  Toradol 60 mg IM now Naprosyn EC 500 mg twice a day p.c. when necessary pain. Ice to the sore areas of the  chest. Limited movement That exacerbates pain. Followup with her PCP, Dr. Renae Gloss later this week as needed.        Hayden Rasmussen, NP 07/05/12 2202

## 2012-07-05 NOTE — ED Notes (Signed)
Pt has  Pain  r   Side     Pt  Reports  The  Pain  Has  Been   Present  For about  1  Week  She  denys  Any          Known  Injury  She  denys   Any  Known  Causative  Agents           She     Reports  The   Pain is  Worse  When  She  Moves  And  Or  Takes  A  Deep  Breath         She     Ambulated  To  Room  With a    Fluid  Steady  Gait   She  Is   Awake  As  Well  As  Alert and  Oriented    Her  Skin is  Warm  And  Dry  Her  Cap  Refill  Is  Brisk

## 2012-07-06 NOTE — ED Provider Notes (Signed)
Medical screening examination/treatment/procedure(s) were performed by non-physician practitioner and as supervising physician I was immediately available for consultation/collaboration.  Leslee Home, M.D.   Reuben Likes, MD 07/06/12 1055

## 2012-07-07 ENCOUNTER — Ambulatory Visit
Admission: RE | Admit: 2012-07-07 | Discharge: 2012-07-07 | Disposition: A | Discharge status: Home / Self Care | Payer: BC Managed Care – PPO | Source: Ambulatory Visit | Attending: Internal Medicine | Admitting: Internal Medicine

## 2012-07-07 DIAGNOSIS — Z1231 Encounter for screening mammogram for malignant neoplasm of breast: Secondary | ICD-10-CM

## 2012-07-08 ENCOUNTER — Ambulatory Visit: Payer: BC Managed Care – PPO

## 2012-07-21 ENCOUNTER — Emergency Department (HOSPITAL_COMMUNITY)
Admission: EM | Admit: 2012-07-21 | Discharge: 2012-07-21 | Disposition: A | Discharge status: Home / Self Care | Payer: BC Managed Care – PPO | Source: Home / Self Care | Attending: Family Medicine | Admitting: Family Medicine

## 2012-07-21 ENCOUNTER — Encounter (HOSPITAL_COMMUNITY): Payer: Self-pay

## 2012-07-21 ENCOUNTER — Emergency Department (INDEPENDENT_AMBULATORY_CARE_PROVIDER_SITE_OTHER): Payer: BC Managed Care – PPO

## 2012-07-21 DIAGNOSIS — R0781 Pleurodynia: Secondary | ICD-10-CM

## 2012-07-21 DIAGNOSIS — D869 Sarcoidosis, unspecified: Secondary | ICD-10-CM

## 2012-07-21 DIAGNOSIS — R071 Chest pain on breathing: Secondary | ICD-10-CM

## 2012-07-21 MED ORDER — TRAMADOL HCL 50 MG PO TABS: 50.0000 mg | ORAL_TABLET | Freq: Four times a day (QID) | ORAL | Status: DC | PRN

## 2012-07-21 MED ORDER — PREDNISONE 10 MG PO TABS: ORAL_TABLET | ORAL | Status: DC

## 2012-07-21 NOTE — ED Provider Notes (Signed)
History     CSN: [id number]  Arrival date & time 07/21/12  7846   First MD Initiated Contact with Patient 07/21/12 [phone]      Chief Complaint  Patient presents with  . Breast Pain    (Consider location/radiation/quality/duration/timing/severity/associated sxs/prior treatment) HPI Comments: 51 year old female with history of sarcoidosis. Here complaining of right side chest wall pain for about 3 weeks. Patient localized pain at the rib cage below her right breast. She was seen here on October 28 for same complaint has taken a medication which has helped with the pain and had a mammogram on October 30 that was normal. Reports her pain is worse with movement and with taking a deep breath. Denies shortness of breath.   Past Medical History  Diagnosis Date  . Dyspnea   . Sarcoidosis   . GERD (gastroesophageal reflux disease)     Past Surgical History  Procedure Date  . Tubal ligation and reversal   . Laparoscopy   . Removal bone spurs right shoulder     No family history on file.  History  Substance Use Topics  . Smoking status: Never Smoker   . Smokeless tobacco: Not on file  . Alcohol Use: No    OB History    Grav Para Term Preterm Abortions TAB SAB Ect Mult Living                  Review of Systems  Constitutional: Negative for fever, chills, appetite change and fatigue.  Respiratory: Negative for cough, shortness of breath and wheezing.   Cardiovascular: Positive for chest pain. Negative for palpitations and leg swelling.  Gastrointestinal: Negative for nausea, vomiting, abdominal pain and diarrhea.  Skin: Negative for rash.  All other systems reviewed and are negative.    Allergies  Penicillins  Home Medications   Current Outpatient Rx  Name  Route  Sig  Dispense  Refill  . ERGOCALCIFEROL 50000 UNITS PO CAPS   Oral   Take 50,000 Units by mouth once a week.           Marland Kitchen NAPROXEN 500 MG PO TBEC   Oral   Take 1 tablet (500 mg total) by mouth 2 (two)  times daily with a meal.   24 tablet   0   . PREDNISONE 10 MG PO TABS      Take 4 tablets x 2 days, 3 tablets x 2 days, 2 tablets x 2 days, 1 tablet x 2 days, then stop   20 tablet   0   . TRAMADOL HCL 50 MG PO TABS   Oral   Take 1 tablet (50 mg total) by mouth every 6 (six) hours as needed for pain.   20 tablet   0     BP 121/82  Pulse 77  Temp 98.2 F (36.8 C) (Oral)  Resp 16  SpO2 98%  Physical Exam  Nursing note and vitals reviewed. Constitutional: She is oriented to person, place, and time. She appears well-developed and well-nourished. No distress.  HENT:  Head: Normocephalic and atraumatic.  Pulmonary/Chest: Effort normal and breath sounds normal. No respiratory distress. She has no wheezes. She has no rales.       Very tender with light and deep palpation over rib cage impress at level of 4th-6th ribs below her right breast. Impress pain also has a pleuritic nature as it is elicited by deep inspiration.  Abdominal: Soft. Bowel sounds are normal. There is no tenderness.  No hepatosplenomegaly  Musculoskeletal:       Asper chest exam  Neurological: She is alert and oriented to person, place, and time.  Skin: No rash noted. She is not diaphoretic.    ED Course  Procedures (including critical care time)  Labs Reviewed - No data to display Dg Ribs Unilateral W/chest Right  07/21/2012  *RADIOLOGY REPORT*  Clinical Data: Right chest pain.  No known injury.  History of sarcoidosis.  RIGHT RIBS AND CHEST - 3+ VIEW  Comparison: Chest radiographs 04/16/2011.  Findings: A metallic BB was placed along the right anterior costal margin.  No rib fracture or focal rib lesion is identified.  Heart size and mediastinal contours are stable.  There is no pleural effusion or pneumothorax.  Interstitial prominence throughout the lungs is unchanged and likely related to the diagnosis of sarcoidosis.  IMPRESSION:  1.  No acute osseous or focal rib lesions identified. 2.  Stable  interstitial prominence attributed to sarcoidosis.   Original Report Authenticated By: Carey Bullocks, M.D.      1. Anterior pleuritic pain   2. Sarcoidosis       MDM  51 y/o female h/o sarcoidosis. Here c/o right anterior pleuritic type of chest pain. No interstitial changes or bone findings on X-rays, impress possible pleuritic involvement of sarcoidosis vs sarcoid related myositis. Prescribed steroid taper and tramadol, continue NSAIDs. Asked to follow up with her pulmonologist as she will likely require referral for non emergent CT if persistent symptoms.         Sharin Grave, MD 07/22/12 9147

## 2012-07-21 NOTE — ED Notes (Signed)
Patient c/o left side rib cage pain, states pain radiates under left breast was seen 10/28 same sx, states worse pain when she coughs

## 2012-08-03 ENCOUNTER — Encounter: Payer: Self-pay | Admitting: Internal Medicine

## 2012-08-03 ENCOUNTER — Ambulatory Visit (INDEPENDENT_AMBULATORY_CARE_PROVIDER_SITE_OTHER): Payer: BC Managed Care – PPO | Admitting: Internal Medicine

## 2012-08-03 ENCOUNTER — Other Ambulatory Visit (INDEPENDENT_AMBULATORY_CARE_PROVIDER_SITE_OTHER): Payer: BC Managed Care – PPO

## 2012-08-03 VITALS — BP 100/60 | HR 84 | Ht 66.0 in | Wt 149.0 lb

## 2012-08-03 DIAGNOSIS — M940 Chondrocostal junction syndrome [Tietze]: Secondary | ICD-10-CM

## 2012-08-03 DIAGNOSIS — D869 Sarcoidosis, unspecified: Secondary | ICD-10-CM

## 2012-08-03 LAB — BASIC METABOLIC PANEL
CO2: 29 mEq/L (ref 19–32)
Calcium: 9.5 mg/dL (ref 8.4–10.5)
Creatinine, Ser: 0.8 mg/dL (ref 0.4–1.2)
GFR: 94.66 mL/min (ref >60.00)

## 2012-08-03 LAB — HEPATIC FUNCTION PANEL
Bilirubin, Direct: 0.1 mg/dL (ref 0.0–0.3)
Total Bilirubin: 0.4 mg/dL (ref 0.3–1.2)
Total Protein: 7.4 g/dL (ref 6.0–8.3)

## 2012-08-03 LAB — CBC
MCHC: 32 g/dL (ref 30.0–36.0)
MCV: 89.9 fl (ref 78.0–100.0)
Platelets: 261 10*3/uL (ref 150.0–400.0)

## 2012-08-03 NOTE — Patient Instructions (Addendum)
Order- lab ACE level, CBC, BMET, Liver panel, sed rate    Dx sarcoid

## 2012-08-03 NOTE — Progress Notes (Signed)
Subjective:    Patient ID: Anne Duncan, female    DOB: [birth year], 51 y.o.   MRN: [id number]  HPI 05/02/11- 61 year old never smoker followed for history of sarcoid complicated by GERD  Last here July 17, 2010 Last visit we had suggested she return prn.  Now 1 week ago as she got up for work, she noted substernal pressure sense without wheeze, dyspnea, or GI symptoms. This lasted through the day, worse with acitivity, walking. Next day had some fever and chillling that faded by night. She went to Urgent Care - CXR "spot", bloodwork "fine". Gave tylenol w/ codiene -relieved discomfort. Gave prilosec for ? reason- made her nauseated.  CXR- 04/26/11- diffuse increased markings c/w sarcoid, stable RUL nodule c/w 2009.  She now feels well.  We reviewed CXR images together back to 2008. Interstitial markings may be a little denser. We think we can see the peripheral RUL nodule back that far, obscured by rib.   08/03/12- 51 year old never smoker followed for history of sarcoid complicated by GERD FOLLOWS FOR: breathing has improved since last OV. Denies SOB, chest pain or tighness. Does report a slight cough at times. An urgent care told her "cartilage swollen" to explain pain under right breast. They did a chest x-ray which was negative. A prednisone taper did help and was finished last week. She has chronic night sweats but the pattern has not changed. Denies adenopathy. CXR 07/21/12- reviewed-negative except prominent bronchovascular markings.    Review of Systems-see HPI Constitutional:   No- current  weight loss, night sweats, fevers, chills, fatigue, lassitude. HEENT:   No-  headaches, difficulty swallowing, tooth/dental problems, sore throat,       No-  sneezing, itching, ear ache, nasal congestion, post nasal drip,  CV:  + Atypical  chest pain, no-orthopnea, PND, swelling in lower extremities, anasarca, dizziness, palpitations Resp: No-   shortness of breath with exertion or at rest.            No-   productive cough,  No non-productive cough,  No-  coughing up of blood.              No-   change in color of mucus.  No- wheezing.   Skin: No-   rash or lesions. GI:  No-   heartburn, indigestion, abdominal pain, nausea, vomiting,  GU: n. MS:  No-   joint pain or swelling.   Neuro- nothing unusual  Psych:  No- change in mood or affect. No depression or anxiety.  No memory loss.     Objective:   Physical Exam General- Alert, Oriented, Affect-appropriate, Distress- none acute  WDWN Skin- rash-none, lesions- none, excoriation- none Lymphadenopathy- none Head- atraumatic            Eyes- Gross vision intact, PERRLA, conjunctivae clear secretions            Ears- Hearing, canals normal            Nose- Clear, no-Septal dev, mucus, polyps, erosion, perforation   Sniffing lightly            Throat- Mallampati III , mucosa clear , drainage- none, tonsils- atrophic Neck- flexible , trachea midline, no stridor , thyroid nl, carotid no bruit Chest - symmetrical excursion , unlabored           Heart/CV- RRR , no murmur , no gallop  , no rub, nl s1 s2                           -  JVD- none , edema- none, stasis changes- none, varices- none           Lung- clear to P&A, wheeze- none, cough- none , dullness-none, rub- none           Chest wall- tender along costochondral joints on right Abd- No HSM Br/ Gen/ Rectal- Not done, not indicated Extrem- cyanosis- none, clubbing, none, atrophy- none, strength- nl Neuro- grossly intact to observatio    Assessment & Plan:

## 2012-08-13 ENCOUNTER — Telehealth: Payer: Self-pay | Admitting: Internal Medicine

## 2012-08-13 DIAGNOSIS — M940 Chondrocostal junction syndrome [Tietze]: Secondary | ICD-10-CM | POA: Insufficient documentation

## 2012-08-13 NOTE — Progress Notes (Signed)
Quick Note:  LMTCB ______ 

## 2012-08-13 NOTE — Telephone Encounter (Signed)
Notes Recorded by Ronny Bacon, CMA on 08/13/2012 at 12:21 PM [facility] ------  Notes Recorded by Waymon Budge, MD on 08/03/2012 at 7:51 PM ACE level is low, making active sarcoid unlikely.  Mild anemia and mild elevation of liver enzymes. Katie please send these results to her primary physician      [id number]. Carron Curie, CMA

## 2012-08-13 NOTE — Assessment & Plan Note (Signed)
Chest discomfort seems associated with right-sided sternum, costochondritis. Plan-pain relievers, heating pad

## 2012-08-13 NOTE — Assessment & Plan Note (Signed)
She is probably in remission still. Note that she had prednisone from urgent care. Plan-ACE level, CBC, BMET, liver panel

## 2012-08-16 NOTE — Telephone Encounter (Signed)
Pt called back-would like results, please.  Anne Duncan

## 2012-08-16 NOTE — Telephone Encounter (Signed)
Returning call.

## 2012-08-16 NOTE — Telephone Encounter (Signed)
Spoke with patient. Aware of results and will fax to PCP-Dr Renae Gloss.

## 2012-08-18 ENCOUNTER — Telehealth: Payer: Self-pay | Admitting: Internal Medicine

## 2012-08-18 MED ORDER — TRAMADOL HCL 50 MG PO TABS: ORAL_TABLET | ORAL | Status: DC

## 2012-08-18 NOTE — Telephone Encounter (Signed)
Pt c/o increased sob for past 4 days.  Chest tightness and pain under right breast is coming back.  Non prod cough.  Denies fever.  Pt has tried otc Robitussin and Mucinex without relief.  Please advise.  Last ov: 08-03-12    Next ov: None scheduled Allergies  Allergen Reactions  . Penicillins     REACTION: intolerant

## 2012-08-18 NOTE — Telephone Encounter (Signed)
Offer tramadol 50 kg, # 30, 1 every 8 hours if needed for pain or cough  No refill.

## 2012-08-18 NOTE — Telephone Encounter (Signed)
Pt notified of Dr Roxy Cedar recommendations and informed that rx for Tramadol was sent to pharmacy.

## 2012-09-07 ENCOUNTER — Telehealth: Payer: Self-pay | Admitting: Internal Medicine

## 2012-09-07 NOTE — Telephone Encounter (Signed)
Patient calls says cough related pain x 1 month, worse x 2 weeks with radiation to back but even more so last night. All related to cough/ She wants prednisone and ultram for this.   Take prednisone 40 mg daily x 2 days, then 20mg  daily x 2 days, then 10mg  daily x 2 days, then 5mg  daily x 2 days and stop  Ultram 50mg  three times daily prn x 1 week  No refills  Telphone Rx left in with [facility] 3:51 PM at 09/07/2012

## 2012-09-15 ENCOUNTER — Emergency Department (HOSPITAL_COMMUNITY)
Admission: EM | Admit: 2012-09-15 | Discharge: 2012-09-15 | Disposition: A | Discharge status: Home / Self Care | Payer: BC Managed Care – PPO | Source: Home / Self Care

## 2012-09-15 ENCOUNTER — Emergency Department (INDEPENDENT_AMBULATORY_CARE_PROVIDER_SITE_OTHER): Payer: BC Managed Care – PPO

## 2012-09-15 ENCOUNTER — Encounter (HOSPITAL_COMMUNITY): Payer: Self-pay | Admitting: *Deleted

## 2012-09-15 DIAGNOSIS — R071 Chest pain on breathing: Secondary | ICD-10-CM

## 2012-09-15 DIAGNOSIS — M940 Chondrocostal junction syndrome [Tietze]: Secondary | ICD-10-CM

## 2012-09-15 DIAGNOSIS — D869 Sarcoidosis, unspecified: Secondary | ICD-10-CM

## 2012-09-15 DIAGNOSIS — R0789 Other chest pain: Secondary | ICD-10-CM

## 2012-09-15 MED ORDER — PREDNISONE 10 MG PO TABS: ORAL_TABLET | ORAL | Status: DC

## 2012-09-15 MED ORDER — TRAMADOL HCL 50 MG PO TABS: 50.0000 mg | ORAL_TABLET | Freq: Four times a day (QID) | ORAL | Status: DC | PRN

## 2012-09-15 NOTE — ED Provider Notes (Addendum)
History     CSN: [id number]  Arrival date & time 09/15/12  1012   None     Chief Complaint  Patient presents with  . Flank Pain    (Consider location/radiation/quality/duration/timing/severity/associated sxs/prior treatment) Patient is a 52 y.o. female presenting with flank pain. The history is provided by the patient. No language interpreter was used.  Flank Pain This is a new problem. The current episode started 6 to 12 hours ago. The problem occurs constantly. The problem has not changed since onset.Associated symptoms include chest pain. The symptoms are aggravated by twisting. Nothing relieves the symptoms. She has tried nothing for the symptoms.  Pt has a history of sarcoid and recently has had problems with chest inflamation  Past Medical History  Diagnosis Date  . Dyspnea   . Sarcoidosis   . GERD (gastroesophageal reflux disease)     Past Surgical History  Procedure Date  . Tubal ligation and reversal   . Laparoscopy   . Removal bone spurs right shoulder     History reviewed. No pertinent family history.  History  Substance Use Topics  . Smoking status: Former Smoker -- 0.1 packs/day for 12 years    Types: Cigarettes    Quit date: 07/22/1995  . Smokeless tobacco: Not on file  . Alcohol Use: No    OB History    Grav Para Term Preterm Abortions TAB SAB Ect Mult Living                  Review of Systems  Cardiovascular: Positive for chest pain.  Genitourinary: Positive for flank pain.  All other systems reviewed and are negative.    Allergies  Penicillins  Home Medications   Current Outpatient Rx  Name  Route  Sig  Dispense  Refill  . ERGOCALCIFEROL 50000 UNITS PO CAPS   Oral   Take 50,000 Units by mouth once a week.           . TRAMADOL HCL 50 MG PO TABS      Take one tablet every 8 hours as needed for pain or cough   30 tablet   0     BP 125/85  Pulse 75  Temp 98.5 F (36.9 C) (Oral)  Resp 18  SpO2 97%  Physical Exam    Nursing note and vitals reviewed. Constitutional: She appears well-developed and well-nourished.  HENT:  Head: Normocephalic.  Right Ear: External ear normal.  Left Ear: External ear normal.  Mouth/Throat: Oropharynx is clear and moist.  Eyes: Conjunctivae normal and EOM are normal. Pupils are equal, round, and reactive to light.  Neck: Normal range of motion. Neck supple.  Cardiovascular: Normal rate.   Pulmonary/Chest: Effort normal and breath sounds normal.  Abdominal: Soft.  Musculoskeletal: Normal range of motion.  Neurological: She is alert.  Skin: Skin is warm.    ED Course  Procedures (including critical care time)  Labs Reviewed - No data to display Dg Chest 2 View  09/15/2012  *RADIOLOGY REPORT*  Clinical Data: Left lateral chest pain, shortness of breath.  CHEST - 2 VIEW  Comparison: 07/21/2012  Findings: Peribronchial thickening and interstitial prominence, stable since prior study.  No confluent airspace opacities or effusions.  Heart is normal size.  No acute bony abnormality.  IMPRESSION: Stable peribronchial thickening and interstitial prominence.   Original Report Authenticated By: Charlett Nose, M.D.      No diagnosis found.    MDM  Pt reports she improved in the past with tramadol  and prednisone.   Pt given 6 day taper and tramadol.   Pt advised to see Dr. Renae Gloss for recheck in the next week        Lonia Skinner [place], [place] 09/15/12 [address] [place], [place] 09/15/12 [phone]

## 2012-09-15 NOTE — ED Notes (Signed)
Pt reports left side pain that starts at top of shoulder and radiates to bottom of hip - worse with coughing - originally started on the left side more than 2 months ago - has hx of sarcoidosis - just recently completed steroid dose pack.

## 2012-09-15 NOTE — ED Provider Notes (Signed)
Medical screening examination/treatment/procedure(s) were performed by non-physician practitioner and as supervising physician I was immediately available for consultation/collaboration.  Raynald Blend, MD 09/15/12 1202

## 2012-09-15 NOTE — ED Provider Notes (Signed)
Medical screening examination/treatment/procedure(s) were performed by non-physician practitioner and as supervising physician I was immediately available for consultation/collaboration.  Raynald Blend, MD 09/15/12 [phone]

## 2012-12-20 ENCOUNTER — Encounter: Payer: Self-pay | Admitting: Internal Medicine

## 2013-08-16 ENCOUNTER — Other Ambulatory Visit: Payer: Self-pay

## 2013-08-16 DIAGNOSIS — Z1231 Encounter for screening mammogram for malignant neoplasm of breast: Secondary | ICD-10-CM

## 2013-09-20 ENCOUNTER — Ambulatory Visit
Admission: RE | Admit: 2013-09-20 | Discharge: 2013-09-20 | Disposition: A | Discharge status: Home / Self Care | Payer: No Typology Code available for payment source | Source: Ambulatory Visit

## 2013-09-20 DIAGNOSIS — Z1231 Encounter for screening mammogram for malignant neoplasm of breast: Secondary | ICD-10-CM

## 2015-04-25 ENCOUNTER — Encounter: Payer: Self-pay | Admitting: Gastroenterology

## 2015-06-08 ENCOUNTER — Emergency Department (HOSPITAL_COMMUNITY)
Admission: EM | Admit: 2015-06-08 | Discharge: 2015-06-08 | Disposition: A | Discharge status: Home / Self Care | Payer: No Typology Code available for payment source | Attending: Emergency Medicine | Admitting: Emergency Medicine

## 2015-06-08 ENCOUNTER — Encounter (HOSPITAL_COMMUNITY): Payer: Self-pay | Admitting: Family Medicine

## 2015-06-08 DIAGNOSIS — R55 Syncope and collapse: Secondary | ICD-10-CM | POA: Insufficient documentation

## 2015-06-08 DIAGNOSIS — Z8719 Personal history of other diseases of the digestive system: Secondary | ICD-10-CM | POA: Insufficient documentation

## 2015-06-08 DIAGNOSIS — Z87891 Personal history of nicotine dependence: Secondary | ICD-10-CM | POA: Insufficient documentation

## 2015-06-08 DIAGNOSIS — Z862 Personal history of diseases of the blood and blood-forming organs and certain disorders involving the immune mechanism: Secondary | ICD-10-CM | POA: Insufficient documentation

## 2015-06-08 LAB — CBC
HCT: 36.8 % (ref 36.0–46.0)
Hemoglobin: 11.7 g/dL — ABNORMAL LOW (ref 12.0–15.0)
MCH: 28.3 pg (ref 26.0–34.0)
MCHC: 31.8 g/dL (ref 30.0–36.0)
MCV: 88.9 fL (ref 78.0–100.0)
Platelets: 292 10*3/uL (ref 150–400)
RBC: 4.14 MIL/uL (ref 3.87–5.11)
RDW: 13.5 % (ref 11.5–15.5)
WBC: 5.2 10*3/uL (ref 4.0–10.5)

## 2015-06-08 LAB — BASIC METABOLIC PANEL
Anion gap: 9 (ref 5–15)
BUN: 8 mg/dL (ref 6–20)
CO2: 29 mmol/L (ref 22–32)
Calcium: 9.7 mg/dL (ref 8.9–10.3)
Chloride: 103 mmol/L (ref 101–111)
Creatinine, Ser: 1.04 mg/dL — ABNORMAL HIGH (ref 0.44–1.00)
GFR calc Af Amer: >60 mL/min (ref >60)
GFR calc non Af Amer: >60 mL/min (ref >60)
Glucose, Bld: 116 mg/dL — ABNORMAL HIGH (ref 65–99)
Potassium: 4 mmol/L (ref 3.5–5.1)
Sodium: 141 mmol/L (ref 135–145)

## 2015-06-08 LAB — URINALYSIS, ROUTINE W REFLEX MICROSCOPIC
Bilirubin Urine: NEGATIVE
Glucose, UA: NEGATIVE mg/dL
Hgb urine dipstick: NEGATIVE
Ketones, ur: NEGATIVE mg/dL
Nitrite: NEGATIVE
Protein, ur: NEGATIVE mg/dL
Specific Gravity, Urine: 1.01 (ref 1.005–1.030)
Urobilinogen, UA: 1 mg/dL (ref 0.0–1.0)
pH: 7 (ref 5.0–8.0)

## 2015-06-08 LAB — URINE MICROSCOPIC-ADD ON

## 2015-06-08 LAB — CBG MONITORING, ED: Glucose-Capillary: 137 mg/dL — ABNORMAL HIGH (ref 65–99)

## 2015-06-08 NOTE — ED Notes (Signed)
Pt sts that she has been having intermittent episodes over the past week of pre syncope. Pt describes it as she is aware that she is there and people are talking to her but she is unable to respond. sts her family witnessed a few and they last about 30 seconds. Denies chest pain sts slight SOB.

## 2015-06-08 NOTE — ED Provider Notes (Signed)
CSN: [id number]     Arrival date & time 06/08/15  1225 History   First MD Initiated Contact with Patient 06/08/15 1600     Chief Complaint  Patient presents with  . Near Syncope     (Consider location/radiation/quality/duration/timing/severity/associated sxs/prior Treatment) HPI   54 year old female with intermittent episodes what sounds near syncope. Patient will have brief episodes where she feels somewhat dizzy. She can hear people around her talking but cannot respond to them. Has not ever lost consciousness. Family reports these episodes last approximately 20-30 seconds. Patient often appears pale in color. Denies any pain with these episodes. No palpitations. No shortness of breath. No appreciable exacerbating relieving factors.   Past Medical History  Diagnosis Date  . Dyspnea   . Sarcoidosis   . GERD (gastroesophageal reflux disease)    Past Surgical History  Procedure Laterality Date  . Tubal ligation and reversal    . Laparoscopy    . Removal bone spurs right shoulder     History reviewed. No pertinent family history. Social History  Substance Use Topics  . Smoking status: Former Smoker -- 0.10 packs/day for 12 years    Types: Cigarettes    Quit date: 07/22/1995  . Smokeless tobacco: None  . Alcohol Use: No   OB History    No data available     Review of Systems  All systems reviewed and negative, other than as noted in HPI.   Allergies  Penicillins  Home Medications   Prior to Admission medications   Medication Sig Start Date End Date Taking? Authorizing Provider  ergocalciferol (VITAMIN D2) 50000 UNITS capsule Take 50,000 Units by mouth once a week.      Historical Provider, MD  predniSONE (DELTASONE) 10 MG tablet 6,5,4,3,2,1 taper 09/15/12   Elson Areas, PA-C  traMADol (ULTRAM) 50 MG tablet Take one tablet every 8 hours as needed for pain or cough 08/18/12   Waymon Budge, MD  traMADol (ULTRAM) 50 MG tablet Take 1 tablet (50 mg total) by mouth  every 6 (six) hours as needed for pain. 09/15/12   Elson Areas, PA-C   BP 123/74 mmHg  Pulse 86  Temp(Src) 98 F (36.7 C)  Resp 18  SpO2 100% Physical Exam  Constitutional: She appears well-developed and well-nourished. No distress.  HENT:  Head: Normocephalic and atraumatic.  Eyes: Conjunctivae are normal. Right eye exhibits no discharge. Left eye exhibits no discharge.  Neck: Neck supple.  Cardiovascular: Normal rate, regular rhythm and normal heart sounds.  Exam reveals no gallop and no friction rub.   No murmur heard. Pulmonary/Chest: Effort normal and breath sounds normal. No respiratory distress.  Abdominal: Soft. She exhibits no distension. There is no tenderness.  Musculoskeletal: She exhibits no edema or tenderness.  Neurological: She is alert.  Skin: Skin is warm and dry.  Psychiatric: She has a normal mood and affect. Her behavior is normal. Thought content normal.  Nursing note and vitals reviewed.   ED Course  Procedures (including critical care time) Labs Review Labs Reviewed  BASIC METABOLIC PANEL - Abnormal; Notable for the following:    Glucose, Bld 116 (*)    Creatinine, Ser 1.04 (*)    All other components within normal limits  CBC - Abnormal; Notable for the following:    Hemoglobin 11.7 (*)    All other components within normal limits  CBG MONITORING, ED - Abnormal; Notable for the following:    Glucose-Capillary 137 (*)    All other components  within normal limits  URINALYSIS, ROUTINE W REFLEX MICROSCOPIC (NOT AT [facility])    Imaging Review No results found. I have personally reviewed and evaluated these images and lab results as part of my medical decision-making.   EKG Interpretation   Date/Time:  Friday June 08 2015 12:35:24 EDT Ventricular Rate:  87 PR Interval:  164 QRS Duration: 82 QT Interval:  356 QTC Calculation: 428 R Axis:   -50 Text Interpretation:  Normal sinus rhythm Left atrial enlargement Left  anterior fascicular block  ABNORMAL R WAVE PROGRESSION Abnormal ECG No old  tracing to compare Confirmed by KOHUT  MD, STEPHEN ([id number]) on 06/08/2015  4:02:04 PM      MDM   Final diagnoses:  Near syncope    54yF with what sounds to me like near syncope. Workup fairly unremarkable. She is generally well appearing and hemodynamically stable. Low suspicion for emergent cause. I feel she is appropriate for discharge at this time. Return precautions were discussed. Outpatient follow-up otherwise.    Raeford Razor, MD 06/16/15 [phone]

## 2015-06-08 NOTE — Discharge Instructions (Signed)
Near-Syncope °Near-syncope (commonly known as near fainting) is sudden weakness, dizziness, or feeling like you might pass out. This can happen when getting up or while standing for a long time. It is caused by a sudden decrease in blood flow to the brain, which can occur for various reasons. Most of the reasons are not serious.  °HOME CARE °Watch your condition for any changes. °· Have someone stay with you until you feel stable. °· If you feel like you are going to pass out: °¨ Lie down right away. °¨ Prop your feet up if you can. °¨ Breathe deeply and steadily. °¨ Move only when the feeling has gone away. Most of the time, this feeling lasts only a few minutes. You may feel tired for several hours. °· Drink enough fluids to keep your pee (urine) clear or pale yellow. °· If you are taking blood pressure or heart medicine, stand up slowly. °· Follow up with your doctor as told. °GET HELP RIGHT AWAY IF:  °· You have a severe headache. °· You have unusual pain in the chest, belly (abdomen), or back. °· You have bleeding from the mouth or butt (rectum), or you have black or tarry poop (stool). °· You feel your heart beat differently than normal, or you have a very fast pulse. °· You pass out, or you twitch and shake when you pass out. °· You pass out when sitting or lying down. °· You feel confused. °· You have trouble walking. °· You are weak. °· You have vision problems. °MAKE SURE YOU:  °· Understand these instructions. °· Will watch your condition. °· Will get help right away if you are not doing well or get worse. °Document Released: 02/11/2008 Document Revised: 08/30/2013 Document Reviewed: 01/28/2013 °[employer]® Patient Information ©2015 [employer]. This information is not intended to replace advice given to you by your health care provider. Make sure you discuss any questions you have with your health care provider. ° °

## 2015-09-23 ENCOUNTER — Emergency Department (INDEPENDENT_AMBULATORY_CARE_PROVIDER_SITE_OTHER)
Admission: EM | Admit: 2015-09-23 | Discharge: 2015-09-23 | Disposition: A | Discharge status: Home / Self Care | Payer: Managed Care, Other (non HMO) | Source: Home / Self Care | Attending: Emergency Medicine | Admitting: Emergency Medicine

## 2015-09-23 ENCOUNTER — Emergency Department (INDEPENDENT_AMBULATORY_CARE_PROVIDER_SITE_OTHER): Payer: Managed Care, Other (non HMO)

## 2015-09-23 ENCOUNTER — Encounter (HOSPITAL_COMMUNITY): Payer: Self-pay | Admitting: *Deleted

## 2015-09-23 DIAGNOSIS — M546 Pain in thoracic spine: Secondary | ICD-10-CM | POA: Diagnosis not present

## 2015-09-23 DIAGNOSIS — M25532 Pain in left wrist: Secondary | ICD-10-CM

## 2015-09-23 MED ORDER — NAPROXEN 500 MG PO TABS: 500.0000 mg | ORAL_TABLET | Freq: Two times a day (BID) | ORAL | Status: DC

## 2015-09-23 NOTE — ED Notes (Deleted)
C/o cold sx onset Friday associated w/congestion, runny nose, facial pressure and dizziness A&O x4... No acute distress.  

## 2015-09-23 NOTE — ED Provider Notes (Signed)
CSN: [id number]     Arrival date & time 09/23/15  1300 History   First MD Initiated Contact with Patient 09/23/15 1326     Chief Complaint  Patient presents with  . URI   (Consider location/radiation/quality/duration/timing/severity/associated sxs/prior Treatment) HPI Back pain since Friday. Wrist pain about 1 week. Feels sharp in nature, taking OTC meds without relief. Unable to lift.  Past Medical History  Diagnosis Date  . Dyspnea   . Sarcoidosis   . GERD (gastroesophageal reflux disease)    Past Surgical History  Procedure Laterality Date  . Tubal ligation and reversal    . Laparoscopy    . Removal bone spurs right shoulder     No family history on file. Social History  Substance Use Topics  . Smoking status: Former Smoker -- 0.10 packs/day for 12 years    Types: Cigarettes    Quit date: 07/22/1995  . Smokeless tobacco: Not on file  . Alcohol Use: No   OB History    No data available     Review of Systems ROS +'ve back pain, wrist pain  Denies: HEADACHE, NAUSEA, ABDOMINAL PAIN, CHEST PAIN, CONGESTION, DYSURIA, SHORTNESS OF BREATH  Allergies  Penicillins  Home Medications   Prior to Admission medications   Medication Sig Start Date End Date Taking? Authorizing Provider  ergocalciferol (VITAMIN D2) 50000 UNITS capsule Take 50,000 Units by mouth once a week.      Historical Provider, MD  predniSONE (DELTASONE) 10 MG tablet 6,5,4,3,2,1 taper 09/15/12   Elson AreasLeslie K Sofia, PA-C  traMADol (ULTRAM) 50 MG tablet Take one tablet every 8 hours as needed for pain or cough 08/18/12   Waymon Budgelinton D Young, MD  traMADol (ULTRAM) 50 MG tablet Take 1 tablet (50 mg total) by mouth every 6 (six) hours as needed for pain. 09/15/12   Elson AreasLeslie K Sofia, PA-C   Meds Ordered and Administered this Visit  Medications - No data to display  BP 132/96 mmHg  Pulse 87  Temp(Src) 97.9 F (36.6 C) (Oral)  SpO2 96% No data found.   Physical Exam  Constitutional: She is oriented to person, place,  and time. She appears well-developed and well-nourished. No distress.  HENT:  Head: Normocephalic and atraumatic.  Right Ear: External ear normal.  Left Ear: External ear normal.  Mouth/Throat: Oropharynx is clear and moist.  Eyes: Conjunctivae are normal.  Pulmonary/Chest: Effort normal.  Abdominal: Soft.  Musculoskeletal: She exhibits tenderness.       Left wrist: She exhibits tenderness and crepitus.       Thoracic back: She exhibits tenderness. She exhibits no swelling, no edema, no deformity and no spasm.       Arms: Neurological: She is alert and oriented to person, place, and time.  Nursing note and vitals reviewed.   ED Course  Procedures (including critical care time)  Labs Review Labs Reviewed - No data to display  Imaging Review No results found.   Visual Acuity Review  Right Eye Distance:   Left Eye Distance:   Bilateral Distance:    Right Eye Near:   Left Eye Near:    Bilateral Near:         MDM   1. Wrist pain, left   2. Thoracic back pain, unspecified back pain laterality    Discussion of xray results with patient.  Advised treatment plan Follow up with PCP Rx for naproxen provided to pharmacy of choice.     Tharon AquasFrank C Carter [place], PA 09/23/15 1421

## 2015-09-23 NOTE — ED Notes (Signed)
Assessment per PA 

## 2015-09-23 NOTE — ED Notes (Signed)
Pt in XR dept. 

## 2015-09-23 NOTE — Discharge Instructions (Signed)
Back Pain, Adult Back pain is very common. The pain often gets better over time. The cause of back pain is usually not dangerous. Most people can learn to manage their back pain on their own.  HOME CARE  Watch your back pain for any changes. The following actions may help to lessen any pain you are feeling:  Stay active. Start with short walks on flat ground if you can. Try to walk farther each day.  Exercise regularly as told by your doctor. Exercise helps your back heal faster. It also helps avoid future injury by keeping your muscles strong and flexible.  Do not sit, drive, or stand in one place for more than 30 minutes.  Do not stay in bed. Resting more than 1-2 days can slow down your recovery.  Be careful when you bend or lift an object. Use good form when lifting:  Bend at your knees.  Keep the object close to your body.  Do not twist.  Sleep on a firm mattress. Lie on your side, and bend your knees. If you lie on your back, put a pillow under your knees.  Take medicines only as told by your doctor.  Put ice on the injured area.  Put ice in a plastic bag.  Place a towel between your skin and the bag.  Leave the ice on for 20 minutes, 2-3 times a day for the first 2-3 days. After that, you can switch between ice and heat packs.  Avoid feeling anxious or stressed. Find good ways to deal with stress, such as exercise.  Maintain a healthy weight. Extra weight puts stress on your back. GET HELP IF:   You have pain that does not go away with rest or medicine.  You have worsening pain that goes down into your legs or buttocks.  You have pain that does not get better in one week.  You have pain at night.  You lose weight.  You have a fever or chills. GET HELP RIGHT AWAY IF:   You cannot control when you poop (bowel movement) or pee (urinate).  Your arms or legs feel weak.  Your arms or legs lose feeling (numbness).  You feel sick to your stomach (nauseous) or  throw up (vomit).  You have belly (abdominal) pain.  You feel like you may pass out (faint).   This information is not intended to replace advice given to you by your health care provider. Make sure you discuss any questions you have with your health care provider.   Document Released: 02/11/2008 Document Revised: 09/15/2014 Document Reviewed: 12/27/2013 Elsevier Interactive Patient Education 2016 Elsevier Inc. Lollie Sailse Quervain Disease Lollie Sailse Quervain disease is inflammation of the tendon on the thumb side of the wrist. Tendons are cords of tissue that connect bones to muscles. The tendons in your hand pass through a tunnel, or sheath. A slippery layer of tissue (synovium) lets the tendons move smoothly in the sheath. With de Quervain disease, the sheath swells or thickens, causing friction and pain. The condition is also called de Quervain tendinosis and de Quervain syndrome. It occurs most often in women who are 4830-793 years old. CAUSES  The exact cause of de Quervain disease is not known. It may result from:   Overusing your hands, especially with repetitive motions that involve twisting your hand or using a forceful grip.  Pregnancy.  Rheumatoid disease. RISK FACTORS You may have a greater risk for de Quervain disease if you:  Are a middle-aged woman.  Are pregnant.  Have rheumatoid arthritis.  Have diabetes.  Use your hands far more than normal, especially with a tight grip or excessive twisting. SIGNS AND SYMPTOMS Pain on the thumb side of your wrist is the main symptom of de Quervain disease. Other signs and symptoms include:  Pain that gets worse when you grasp something or turn your wrist.  Pain that extends up the forearm.  Cysts in the area of the pain.  Swelling of your wrist and hand.  A sensation of snapping in the wrist.  Trouble moving the thumb and wrist. DIAGNOSIS  Your health care provider may diagnose de Quervain disease based on your signs and symptoms. A  physical exam will also be done. A simple test Lourena Simmonds test) that involves pulling your thumb and wrist to see if this causes pain can help determine whether you have the condition. Sometimes you may need to have an X-ray.  TREATMENT  Avoiding any activity that causes pain and swelling is the best treatment. Other options include:  Wearing a splint.  Taking medicine. Anti-inflammatory medicines and corticosteroid injections may reduce inflammation and relieve pain.  Having surgery if other treatments do not work. HOME CARE INSTRUCTIONS   Using ice can be helpful after doing activities that involve the sore wrist. To apply ice to the injured area:  Put ice in a plastic bag.  Place a towel between your skin and the bag.  Leave the ice on for 20 minutes, 2-3 times a day.  Take medicines only as directed by your health care provider.  Wear your splint as directed. This will allow your hand to rest and heal. SEEK MEDICAL CARE IF:   Your pain medicine does not help.   Your pain gets worse.  You develop new symptoms. MAKE SURE YOU:   Understand these instructions.  Will watch your condition.  Will get help right away if you are not doing well or get worse.   This information is not intended to replace advice given to you by your health care provider. Make sure you discuss any questions you have with your health care provider.   Document Released: 05/20/2001 Document Revised: 09/15/2014 Document Reviewed: 12/28/2013 Elsevier Interactive Patient Education [employer]

## 2015-10-10 ENCOUNTER — Encounter: Payer: Self-pay | Admitting: Pulmonary Disease

## 2015-10-10 ENCOUNTER — Other Ambulatory Visit (INDEPENDENT_AMBULATORY_CARE_PROVIDER_SITE_OTHER): Payer: Managed Care, Other (non HMO)

## 2015-10-10 ENCOUNTER — Ambulatory Visit (INDEPENDENT_AMBULATORY_CARE_PROVIDER_SITE_OTHER)
Admission: RE | Admit: 2015-10-10 | Discharge: 2015-10-10 | Disposition: A | Discharge status: Home / Self Care | Payer: Managed Care, Other (non HMO) | Source: Ambulatory Visit | Attending: Pulmonary Disease | Admitting: Pulmonary Disease

## 2015-10-10 ENCOUNTER — Ambulatory Visit (INDEPENDENT_AMBULATORY_CARE_PROVIDER_SITE_OTHER): Payer: Managed Care, Other (non HMO) | Admitting: Pulmonary Disease

## 2015-10-10 VITALS — BP 126/74 | HR 67 | Ht 66.0 in | Wt 154.4 lb

## 2015-10-10 DIAGNOSIS — D869 Sarcoidosis, unspecified: Secondary | ICD-10-CM

## 2015-10-10 LAB — HEPATIC FUNCTION PANEL
ALK PHOS: 69 U/L (ref 39–117)
ALT: 10 U/L (ref 0–35)
AST: 16 U/L (ref 0–37)
Albumin: 4.1 g/dL (ref 3.5–5.2)
BILIRUBIN DIRECT: 0.1 mg/dL (ref 0.0–0.3)
BILIRUBIN TOTAL: 0.4 mg/dL (ref 0.2–1.2)
TOTAL PROTEIN: 7.7 g/dL (ref 6.0–8.3)

## 2015-10-10 LAB — COMPREHENSIVE METABOLIC PANEL
ALT: 10 U/L (ref 0–35)
AST: 16 U/L (ref 0–37)
Albumin: 4.1 g/dL (ref 3.5–5.2)
Alkaline Phosphatase: 69 U/L (ref 39–117)
BILIRUBIN TOTAL: 0.4 mg/dL (ref 0.2–1.2)
BUN: 12 mg/dL (ref 6–23)
CALCIUM: 9.6 mg/dL (ref 8.4–10.5)
CO2: 28 meq/L (ref 19–32)
Chloride: 103 mEq/L (ref 96–112)
Creatinine, Ser: 0.78 mg/dL (ref 0.40–1.20)
GFR: 99.05 mL/min (ref >60.00)
Glucose, Bld: 93 mg/dL (ref 70–99)
Potassium: 3.7 mEq/L (ref 3.5–5.1)
Sodium: 140 mEq/L (ref 135–145)
Total Protein: 7.7 g/dL (ref 6.0–8.3)

## 2015-10-10 LAB — CBC WITH DIFFERENTIAL/PLATELET
BASOS ABS: 0 10*3/uL (ref 0.0–0.1)
Basophils Relative: 0.5 % (ref 0.0–3.0)
Eosinophils Absolute: 0 10*3/uL (ref 0.0–0.7)
Eosinophils Relative: 0.4 % (ref 0.0–5.0)
HEMATOCRIT: 39.7 % (ref 36.0–46.0)
HEMOGLOBIN: 12.6 g/dL (ref 12.0–15.0)
LYMPHS PCT: 36.9 % (ref 12.0–46.0)
Lymphs Abs: 3.1 10*3/uL (ref 0.7–4.0)
MCHC: 31.8 g/dL (ref 30.0–36.0)
MCV: 86.6 fl (ref 78.0–100.0)
MONOS PCT: 5.4 % (ref 3.0–12.0)
Monocytes Absolute: 0.4 10*3/uL (ref 0.1–1.0)
NEUTROS ABS: 4.8 10*3/uL (ref 1.4–7.7)
Neutrophils Relative %: 56.8 % (ref 43.0–77.0)
PLATELETS: 371 10*3/uL (ref 150.0–400.0)
RBC: 4.58 Mil/uL (ref 3.87–5.11)
RDW: 13.5 % (ref 11.5–15.5)
WBC: 8.4 10*3/uL (ref 4.0–10.5)

## 2015-10-10 NOTE — Patient Instructions (Signed)
We'll schedule you for chest x-ray and pulmonary function test. We will send her for some blood work to assess activity of sarcoidosis.  Return to clinic in 3 months.

## 2015-10-10 NOTE — Progress Notes (Signed)
   Subjective:    Patient ID: Anne Duncan, female    DOB: [birth year], 55 y.o.   MRN: [id number]  HPI Consult for management of sarcoidosis.  Anne Duncan is a 55 year old with diagnosis of sarcoid. She is a former patient of Dr. Maple Hudson, last seen in 54. She was diagnosed with sarcoid in 1991 with a bronchoscope biopsy. She had been on prednisone for a year at a time and on and off for a few years since then. She is apparently in remission for many years and had stable respiratory symptoms.  She is here to reestablish care. She started developing symptoms of dizziness. She was evaluated in the ED with un remarkble workup. She has recently developed back pain adn was evaluated again in ED with negative X ray. She is worried that these symptoms may be related to recurrence of sarcoid.  DATA: CXR 09/15/12 Stable peribronchial thickening and interstitial prominence.  Social history: She has minimal smoking history. She moves smoked 1 pack per week for about 3-4 years. Quit in 96. No alcohol, illegal drug use. 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 She had a PPD checked in October16 which was negative.  Family history: Sister-asthma Father-leukemia   Past Medical History  Diagnosis Date  . Dyspnea   . Sarcoidosis (HCC)   . GERD (gastroesophageal reflux disease)      Current outpatient prescriptions:  .  ergocalciferol (VITAMIN D2) 50000 UNITS capsule, Take 50,000 Units by mouth once a week.  , Disp: , Rfl:  .  naproxen (NAPROSYN) 500 MG tablet, Take 1 tablet (500 mg total) by mouth 2 (two) times daily., Disp: 30 tablet, Rfl: 0 .  traMADol (ULTRAM) 50 MG tablet, Take 1 tablet (50 mg total) by mouth every 6 (six) hours as needed for pain., Disp: 15 tablet, Rfl: 0   Review of Systems Denies any cough, sputum production, dyspnea, wheezing No chest pain, palpitations No nausea, vomiting, diarrhea, constipation. No fevers, chills, loss of weight, appetite, malaise, weakness. All  other review of systems are negative    Objective:   Physical Exam Blood pressure 126/74, pulse 67, height  (1.676 m), weight 154 lb 6.4 oz (70.035 kg), SpO2 100 %.  Gen: No apparent distress Neuro: No gross focal deficits. Neck: No JVD, lymphadenopathy, thyromegaly. RS: Clear, no wheeze, crackles CVS: S1-S2 heard, no murmurs rubs gallops. Abdomen: Soft, positive bowel sounds. Extremities: No edema.    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 She has nonspecific complaints of dizziness, back pain and wants reevaluation of her sarcoidosis. Her symptoms do not appear to be related to sarcoidosis reactivation. I will assess this with a chest x-ray, pulmonary function tests, blood work including CBC, metabolic panel, LFTs, Ace level.  PLAN: - Chest x-ray, pulmonary function test. - Lab tests. CBC, metabolic panel, LFTs, Ace level.  Chilton Greathouse MD [facility] Pager [phone] If no answer or after 3pm call: [phone] 10/10/2015, 3:22 PM

## 2015-10-11 LAB — ANGIOTENSIN CONVERTING ENZYME: Angiotensin-Converting Enzyme: 16 U/L (ref 8–52)

## 2015-10-12 NOTE — Progress Notes (Signed)
Quick Note:  Called spoke with patient, advised of cxr results as stated by PM. Pt verbalized her understanding and denied any questions. ______ 

## 2015-11-21 ENCOUNTER — Other Ambulatory Visit: Payer: Self-pay | Admitting: Family Medicine

## 2015-11-21 ENCOUNTER — Ambulatory Visit
Admission: RE | Admit: 2015-11-21 | Discharge: 2015-11-21 | Disposition: A | Discharge status: Home / Self Care | Payer: Managed Care, Other (non HMO) | Source: Ambulatory Visit | Attending: Family Medicine | Admitting: Family Medicine

## 2015-11-21 DIAGNOSIS — M25562 Pain in left knee: Secondary | ICD-10-CM

## 2015-11-21 DIAGNOSIS — M545 Low back pain: Secondary | ICD-10-CM

## 2015-11-21 DIAGNOSIS — M25561 Pain in right knee: Secondary | ICD-10-CM

## 2015-11-21 DIAGNOSIS — M130 Polyarthritis, unspecified: Secondary | ICD-10-CM

## 2015-11-21 DIAGNOSIS — M542 Cervicalgia: Secondary | ICD-10-CM

## 2016-01-08 ENCOUNTER — Ambulatory Visit: Payer: Managed Care, Other (non HMO) | Admitting: Pulmonary Disease

## 2017-06-22 ENCOUNTER — Telehealth: Payer: Self-pay | Admitting: *Deleted

## 2017-06-22 ENCOUNTER — Ambulatory Visit (INDEPENDENT_AMBULATORY_CARE_PROVIDER_SITE_OTHER): Payer: PRIVATE HEALTH INSURANCE

## 2017-06-22 ENCOUNTER — Encounter: Payer: Self-pay | Admitting: Neurology

## 2017-06-22 ENCOUNTER — Ambulatory Visit (INDEPENDENT_AMBULATORY_CARE_PROVIDER_SITE_OTHER): Payer: PRIVATE HEALTH INSURANCE | Admitting: Podiatry

## 2017-06-22 ENCOUNTER — Encounter: Payer: Self-pay | Admitting: Podiatry

## 2017-06-22 VITALS — BP 119/77 | HR 69 | Resp 18

## 2017-06-22 DIAGNOSIS — M79673 Pain in unspecified foot: Secondary | ICD-10-CM

## 2017-06-22 DIAGNOSIS — G5750 Tarsal tunnel syndrome, unspecified lower limb: Secondary | ICD-10-CM

## 2017-06-22 DIAGNOSIS — M722 Plantar fascial fibromatosis: Secondary | ICD-10-CM

## 2017-06-22 DIAGNOSIS — M792 Neuralgia and neuritis, unspecified: Secondary | ICD-10-CM

## 2017-06-22 DIAGNOSIS — S93409A Sprain of unspecified ligament of unspecified ankle, initial encounter: Secondary | ICD-10-CM

## 2017-06-22 NOTE — Telephone Encounter (Deleted)
-----   Message from [name], DPM sent at 06/22/2017 10:37 AM EDT ----- Can you please order an MRI of her right heel to rule out stress fracture; plantar fascial tear on the right. Thanks.

## 2017-06-22 NOTE — Progress Notes (Signed)
Subjective:    Patient ID: Anne Duncan, female    DOB: [birth year], 56 y.o.   MRN: [id number]  HPI  56 year old female presents the office if her concerns of her ankle swelling and which she is also states she gets pain to her heels. She appears a seen Dr. Maisie Fus she had 2 injections which did not help at all. She's been icing as well as an open helping. She describes pins and on for 1 year. She states that she gets pain in the nighttime mostly to her heel which does wake her up and she does describes sharp, burning pain to her heels. She previously has seen Dr. Parke Simmers for her back and she was told she may have a pinched nerve in her low back. She denies any hip or knee pain. She denies any radiating up to her leg. She's never had a nerve conduction test. She has no other concerns today.  Review of Systems  All other systems reviewed and are negative.  Past Medical History:  Diagnosis Date  . Dyspnea   . GERD (gastroesophageal reflux disease)   . Sarcoidosis     Past Surgical History:  Procedure Laterality Date  . LAPAROSCOPY    . removal bone spurs right shoulder    . tubal ligation and reversal       Current Outpatient Prescriptions:  .  ergocalciferol (VITAMIN D2) 50000 UNITS capsule, Take 50,000 Units by mouth once a week.  , Disp: , Rfl:  .  naproxen (NAPROSYN) 500 MG tablet, Take 1 tablet (500 mg total) by mouth 2 (two) times daily. (Patient not taking: Reported on 06/22/2017), Disp: 30 tablet, Rfl: 0 .  traMADol (ULTRAM) 50 MG tablet, Take 1 tablet (50 mg total) by mouth every 6 (six) hours as needed for pain. (Patient not taking: Reported on 06/22/2017), Disp: 15 tablet, Rfl: 0  Allergies  Allergen Reactions  . Penicillins     REACTION: intolerant    Social History   Social History  . Marital status: Married    Spouse name: N/A  . Number of children: 0  . Years of education: N/A   Occupational History  . Not on file.   Social History Main Topics  . Smoking  status: Former Smoker    Packs/day: 0.10    Years: 12.00    Types: Cigarettes    Quit date: 07/22/1995  . Smokeless tobacco: Never Used  . Alcohol use No  . Drug use: No  . Sexual activity: Not on file   Other Topics Concern  . 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 Nails x 10 are well manicured; remaining integument appears unremarkable at this time. There are no open sores, no preulcerative lesions, no rash or signs of infection present.  Vascular: Dorsalis Pedis artery and Posterior Tibial artery pedal pulses are 2/4 bilateral with immedate capillary fill time. There is no pain with calf compression, swelling, warmth, erythema.   Neruologic: Grossly intact via light touch bilateral. Protective threshold with Semmes Wienstein monofilament intact to all pedal sites bilateral. Positive tinel sign on the right.   Musculoskeletal: Tenderness to palpation along the plantar medial tubercle of  the calcaneus at the insertion of plantar fascia on the left and right foot. There is no pain along the course of the plantar fascia within the arch of the foot. Plantar fascia appears to be intact. There is no pain with lateral compression of the calcaneus or pain with vibratory sensation. There is no pain along the course or insertion of the achilles tendon. Mild discomfort on the course of the flexor tendons just along the medial aspect of the ankle just posterior to the medial malleolus pain the tendons appear to be intact.No other areas of tenderness to bilateral lower extremities.  Muscular strength 5/5 in all groups tested bilateral.  Gait: Unassisted, Nonantalgic.      Assessment & Plan:  56 year old female with bilateral heel, ankle pain  possibly result of tendinitis, plantar fasciitis however concern for neurological origin -Treatment options discussed including all alternatives, risks, and complications -Etiology of symptoms were discussed -X-rays were obtained and reviewed with the patient. No evidence of acute fracture identified. -She's had steroid injections and anti-inflammatories without any significant improvement. I think that we should treat this from a multifactorial point view. I think that she should work on changing her shoes but also think should benefit from a custom molded orthotic, check with her insurance, without coverage for this. I want her continue icing the area as well. I also think that we should go ahead and get a nerve conduction test given the burning to her heels and the positive Tinel sign with history of back pain. She is also having some sharp pains into her hands that she is concerned for carpal tunnel however discussed with her she should see her primary care physician for this. -Possible MRI after the NCV  Jillyn Ledger, DPM

## 2017-06-22 NOTE — Telephone Encounter (Addendum)
-----   Message from [name], DPM sent at 06/22/2017 10:25 AM EDT ----- Can you please get a NCV bilaterally for her. She has burning pain to her feet, r/o tarsal tunnel; history of back pain possible pinched nerve. Orders faxed to [facility].

## 2017-06-22 NOTE — Telephone Encounter (Addendum)
-----   Message from [name], DPM sent at 06/22/2017 10:37 AM EDT ----- Can you please order an MRI of her right heel to rule out stress fracture; plantar fascial tear on the right. Thanks. Faxed order to [facility] and gave to D. Meadows for [employer].

## 2017-06-23 ENCOUNTER — Telehealth: Payer: Self-pay | Admitting: *Deleted

## 2017-06-23 NOTE — Telephone Encounter (Addendum)
-----   Message from [name], DPM sent at 06/23/2017  7:58 AM EDT ----- Can you cancel the MRI of the heel but can you order a NCV due to neuritis, tarsal tunnel syndrome and back pain. Thanks. Cancelled MRI of heel in [facility], faxed cancellation order to [facility], and informed pt of Dr. Gabriel Rung change of orders. Pt asked to speak to someone concerning her orthotic coverage and I transferred to D. Miner.

## 2017-06-24 ENCOUNTER — Telehealth: Payer: Self-pay | Admitting: Podiatry

## 2017-06-24 NOTE — Telephone Encounter (Signed)
Called pts insurance and orthotics are covered at 60% after 5000.00 deductible (pt has not met any of the deductible) out of pocket is 6250.00. Spoke to patient and discussed it would be probably be better for her to do cash pay if her insurance starts over in January.If we file insurance she would be responsible for 398.00 but if we did cash pay she would be responsible for 300.00. Pt decided to go with cash pay and verified ok for [place] to proceed with ordering the orthotics.

## 2017-06-25 NOTE — Telephone Encounter (Signed)
Please have her scheduled to get molded. Thanks.

## 2017-07-07 ENCOUNTER — Ambulatory Visit (INDEPENDENT_AMBULATORY_CARE_PROVIDER_SITE_OTHER): Payer: PRIVATE HEALTH INSURANCE | Admitting: Neurology

## 2017-07-07 DIAGNOSIS — M722 Plantar fascial fibromatosis: Secondary | ICD-10-CM

## 2017-07-07 DIAGNOSIS — M79671 Pain in right foot: Secondary | ICD-10-CM

## 2017-07-07 DIAGNOSIS — M79672 Pain in left foot: Principal | ICD-10-CM

## 2017-07-07 NOTE — Procedures (Signed)
[facility]  [address], [address]  [place], [place] [address] Tel: [phone] Fax:  [phone] Test Date:  07/07/2017  Patient: Anne Duncan DOB: 06-17-1961 Physician: Nita Sickle, DO  Sex: Female Height: 5\' 6"  Ref Phys: Ovid Curd, DPM  ID#: [id number] Temp: 33.0C Technician:    Patient Complaints: This is a 56 year-old female referred for evaluation of bilateral feet paresthesias and low back pain.  NCV & EMG Findings: Extensive electrodiagnostic testing of the right lower extremity and additional studies of the left shows:  1. Bilateral sural, superficial peroneal, and medial plantar responses are within normal limits. Bilateral lateral plantar responses are absent, which is a normal finding for patient's age. 2. Bilateral peroneal and tibial motor responses are within normal limits. 3. Left tibial H reflex studies are normal limits. 4. There is no evidence of active or chronic motor axon loss changes affecting any of the tested muscles. Motor unit configuration and recruitment pattern is within normal limits.  Impression: This is a normal study of the lower extremities. In particular, there is no evidence of tarsal tunnel syndrome, sensorimotor polyneuropathy, or lumbosacral radiculopathy.   ___________________________ Nita Sickle, DO    Nerve Conduction Studies Anti Sensory Summary Table   Stim Site NR Peak (ms) Norm Peak (ms) P-T Amp (V) Norm P-T Amp  Left Sup Peroneal Anti Sensory (Ant Lat Mall)  33C  12 cm    1.8 <4.6 5.3 >4  Right Sup Peroneal Anti Sensory (Ant Lat Mall)  33C  12 cm    2.1 <4.6 4.1 >4  Left Sural Anti Sensory (Lat Mall)  33C  Calf    3.2 <4.6 10.9 >4  Right Sural Anti Sensory (Lat Mall)  33C  Calf    3.2 <4.6 12.6 >4   Motor Summary Table   Stim Site NR Onset (ms) Norm Onset (ms) O-P Amp (mV) Norm O-P Amp Site1 Site2 Delta-0 (ms) Dist (cm) Vel (m/s) Norm Vel (m/s)  Left Peroneal Motor (Ext Dig Brev)  33C  Ankle     3.4 <6.0 5.9 >2.5 B Fib Ankle 6.8 37.0 54 >40  B Fib    10.2  5.8  Poplt B Fib 1.2 9.0 75 >40  Poplt    11.4  5.8         Right Peroneal Motor (Ext Dig Brev)  33C  Ankle    2.5 <6.0 5.3 >2.5 B Fib Ankle 7.7 39.0 51 >40  B Fib    10.2  5.0  Poplt B Fib 1.2 8.0 67 >40  Poplt    11.4  4.9         Left Tibial Motor (Abd Hall Brev)  33C  Ankle    4.0 <6.0 12.4 >4 Knee Ankle 6.0 40.0 67 >40  Knee    10.0  7.7         Right Tibial Motor (Abd Hall Brev)  33C  Ankle    3.4 <6.0 10.9 >4 Knee Ankle 7.9 41.0 52 >40  Knee    11.3  9.2         Left Tibial (ADQP) Motor (ADQP)  33C  Ankle    4.3 <6.5 5.4 >3        Right Tibial (ADQP) Motor (ADQP)  33C  Ankle    4.6 <6.5 4.6 >3         Mixed Summary Table   Stim Site NR Peak (ms) Norm Peak (ms) P-T Amp (V) Norm P-T Amp  Left  Lateral Plantar Mixed (Med Malleolus)  33C  Lateral Foot NR  <3.7  >8  Right Lateral Plantar Mixed (Med Malleolus)  33C  Lateral Foot NR  <3.7  >8  Left Medial Plantar Mixed (Med Malleolus)  33C  Medial Foot    2.8 <3.7 16.0 >8  Right Medial Plantar Mixed (Med Malleolus)  33C  Medial Foot    2.4 <3.7 20.0 >8   H Reflex Studies   NR H-Lat (ms) Lat Norm (ms) L-R H-Lat (ms)  Left Tibial (Gastroc)  33C     32.93 <35    EMG   Side Muscle Ins Act Fibs Psw Fasc Number Recrt Dur Dur. Amp Amp. Poly Poly. Comment  Left AntTibialis Nml Nml Nml Nml Nml Nml Nml Nml Nml Nml Nml Nml N/A  Right AntTibialis Nml Nml Nml Nml Nml Nml Nml Nml Nml Nml Nml Nml N/A  Right Gastroc Nml Nml Nml Nml Nml Nml Nml Nml Nml Nml Nml Nml N/A  Right Flex Dig Long Nml Nml Nml Nml Nml Nml Nml Nml Nml Nml Nml Nml N/A  Right AbdHallucis Nml Nml Nml Nml Nml Nml Nml Nml Nml Nml Nml Nml N/A  Right RectFemoris Nml Nml Nml Nml Nml Nml Nml Nml Nml Nml Nml Nml N/A  Left Gastroc Nml Nml Nml Nml Nml Nml Nml Nml Nml Nml Nml Nml N/A  Left Flex Dig Long Nml Nml Nml Nml Nml Nml Nml Nml Nml Nml Nml Nml N/A  Left AbdHallucis Nml Nml Nml Nml Nml Nml Nml Nml  Nml Nml Nml Nml N/A  Left RectFemoris Nml Nml Nml Nml Nml Nml Nml Nml Nml Nml Nml Nml N/A      Waveforms:

## 2017-07-17 ENCOUNTER — Ambulatory Visit (INDEPENDENT_AMBULATORY_CARE_PROVIDER_SITE_OTHER): Payer: PRIVATE HEALTH INSURANCE | Admitting: Podiatry

## 2017-07-17 ENCOUNTER — Encounter: Payer: Self-pay | Admitting: Podiatry

## 2017-07-17 DIAGNOSIS — M722 Plantar fascial fibromatosis: Secondary | ICD-10-CM | POA: Diagnosis not present

## 2017-07-17 DIAGNOSIS — M792 Neuralgia and neuritis, unspecified: Secondary | ICD-10-CM

## 2017-07-17 MED ORDER — MELOXICAM 15 MG PO TABS: 15.0000 mg | ORAL_TABLET | Freq: Every day | ORAL | 2 refills | Status: DC

## 2017-07-17 NOTE — Patient Instructions (Signed)

## 2017-07-20 NOTE — Progress Notes (Signed)
Subjective: Anne Duncan presents the office today for follow-up evaluation of heel pain, and neuritis symptoms to both of her heels.  She recently had a nerve conduction test that she presents today to discuss results as well.  She states that she still gets pain in the bottom of her heels and she occasionally describes a burning pain to her feet.  She denies any changes since last appointment her symptoms overall are about the same.  She has no other concerns today. Denies any systemic complaints such as fevers, chills, nausea, vomiting. No acute changes since last appointment, and no other complaints at this time.   Objective: AAO x3, NAD DP/PT pulses palpable bilaterally, CRT less than 3 seconds Tenderness to palpation along the plantar medial tubercle of the calcaneus at the insertion of plantar fascia on the left and right foot. There is no pain along the course of the plantar fascia within the arch of the foot. Plantar fascia appears to be intact. There is no pain with lateral compression of the calcaneus or pain with vibratory sensation. There is no pain along the course or insertion of the achilles tendon. No other areas of tenderness to bilateral lower extremities. Subjectively she still getting burning pain to her feet.  No open lesions or pre-ulcerative lesions.  No pain with calf compression, swelling, warmth, erythema  Assessment: Bilateral heel pain, plantar fasciitis neuritis symptoms  Plan: -All treatment options discussed with the patient including all alternatives, risks, complications.  -Nerve conduction tests were reviewed with the patient was revealed normal study.  There was no evidence of tarsal tunnel syndrome, sensorimotor polyneuropathy or lumbosacral radiculopathy -At this point we will treat more for plantar fasciitis.  She states that her symptoms are worse after she has been on her feet.  Orthotics were dispensed to her today.  Oral and written break instructions were  discussed.  I want her to also continue with stretching, icing exercises on a daily basis.  Discussed possible physical therapy and EPAT.  Will check in the next 4 weeks to see how she is doing or sooner if any issues arise.  She agrees with this plan. -Patient encouraged to call the office with any questions, concerns, change in symptoms.   Anne BarrackMatthew R Duncan DPM

## 2017-08-07 ENCOUNTER — Ambulatory Visit: Payer: PRIVATE HEALTH INSURANCE | Admitting: Podiatry

## 2017-11-19 ENCOUNTER — Ambulatory Visit (HOSPITAL_COMMUNITY)
Admission: EM | Admit: 2017-11-19 | Discharge: 2017-11-19 | Disposition: A | Discharge status: Home / Self Care | Payer: PRIVATE HEALTH INSURANCE | Attending: Internal Medicine | Admitting: Internal Medicine

## 2017-11-19 ENCOUNTER — Encounter (HOSPITAL_COMMUNITY): Payer: Self-pay | Admitting: Family Medicine

## 2017-11-19 ENCOUNTER — Ambulatory Visit (INDEPENDENT_AMBULATORY_CARE_PROVIDER_SITE_OTHER): Payer: PRIVATE HEALTH INSURANCE

## 2017-11-19 DIAGNOSIS — M25512 Pain in left shoulder: Secondary | ICD-10-CM | POA: Diagnosis not present

## 2017-11-19 MED ORDER — MELOXICAM 15 MG PO TABS: 15.0000 mg | ORAL_TABLET | Freq: Every day | ORAL | 0 refills | Status: DC

## 2017-11-19 MED ORDER — KETOROLAC TROMETHAMINE 60 MG/2ML IM SOLN
INTRAMUSCULAR | Status: AC
  Filled 2017-11-19: qty 2

## 2017-11-19 MED ORDER — KETOROLAC TROMETHAMINE 60 MG/2ML IM SOLN
60.0000 mg | Freq: Once | INTRAMUSCULAR | Status: AC
  Administered 2017-11-19: 60 mg via INTRAMUSCULAR

## 2017-11-19 NOTE — ED Provider Notes (Signed)
[facility]    CSN: [id number] Arrival date & time: 11/19/17  1316     History   Chief Complaint Chief Complaint  Patient presents with  . Shoulder Pain  . Arm Pain    HPI Anne Duncan is a 57 y.o. female.   Anne Duncan presents with complaints of left shoulder pain which has been worsening over the past month. No known injuries. Gradual onset. She is right handed. Pain radiates down her arm. At times causes left hand weakness and cramping. Has been taking tylenol and ibuprofen which have minimally helped. Ibuprofen last last night. Tylenol this morning. Rates pain 10/10. Is constant but certain movements worsen it. Denies any repetitive or heavy use. Denies neck pain. States has had surgery to right shoulder for bone spurs and rotator cuff, years ago. History of gerd and sarcoidosis.     ROS per HPI.       Past Medical History:  Diagnosis Date  . Dyspnea   . GERD (gastroesophageal reflux disease)   . Sarcoidosis     Patient Active Problem List   Diagnosis Date Noted  . Costochondritis 08/13/2012  . GASTRITIS 09/21/2008  . GERD 07/19/2008  . SARCOIDOSIS 07/18/2008  . DYSPNEA 07/18/2008    Past Surgical History:  Procedure Laterality Date  . LAPAROSCOPY    . removal bone spurs right shoulder    . tubal ligation and reversal      OB History    No data available       Home Medications    Prior to Admission medications   Medication Sig Start Date End Date Taking? Authorizing Provider  ergocalciferol (VITAMIN D2) 50000 UNITS capsule Take 50,000 Units by mouth once a week.      [provider]  meloxicam (MOBIC) 15 MG tablet Take 1 tablet (15 mg total) by mouth daily. 11/19/17   Georgetta Haber, NP    Family History Family History  Problem Relation Age of Onset  . Asthma Sister     Social History Social History   Tobacco Use  . Smoking status: Former Smoker    Packs/day: 0.10    Years: 12.00    Pack years: 1.20    Types:  Cigarettes    Last attempt to quit: 07/22/1995    Years since quitting: 22.3  . Smokeless tobacco: Never Used  Substance Use Topics  . Alcohol use: No  . Drug use: No     Allergies   Penicillins   Review of Systems Review of Systems   Physical Exam Triage Vital Signs ED Triage Vitals [11/19/17 1420]  Enc Vitals Group     BP 126/83     Pulse Rate 83     Resp 18     Temp      Temp src      SpO2 100 %     Weight      Height      Head Circumference      Peak Flow      Pain Score 10     Pain Loc      Pain Edu?      Excl. in GC?    No data found.  Updated Vital Signs BP 126/83   Pulse 83   Resp 18   SpO2 100%   Visual Acuity Right Eye Distance:   Left Eye Distance:   Bilateral Distance:    Right Eye Near:   Left Eye Near:    Bilateral Near:  Physical Exam  Constitutional: She is oriented to person, place, and time. She appears well-developed and well-nourished. No distress.  Cardiovascular: Normal rate, regular rhythm and normal heart sounds.  Pulmonary/Chest: Effort normal and breath sounds normal.  Musculoskeletal:       Left shoulder: She exhibits decreased range of motion, tenderness, bony tenderness and pain. She exhibits no swelling, no effusion, no crepitus, no deformity, no laceration, no spasm, normal pulse and normal strength.       Arms: Tenderness at anterior shoulder; pain with overhead raise and external rotation behind back; strength equal to bilateral upper extremities in all directions; gross sensation intact; full ROM to wrist, elbow and neck. Without neck tenderness; strong radial pulses, equal bilaterally; without pain with ac join compression   Neurological: She is alert and oriented to person, place, and time.  Skin: Skin is warm and dry.     UC Treatments / Results  Labs (all labs ordered are listed, but only abnormal results are displayed) Labs Reviewed - No data to display  EKG  EKG Interpretation None        Radiology Dg Shoulder Left  Result Date: 11/19/2017 CLINICAL DATA:  Left shoulder pain for 1 month without known injury. EXAM: LEFT SHOULDER - 2+ VIEW COMPARISON:  Radiographs of November 21, 2015. FINDINGS: There is no evidence of fracture or dislocation. There is no evidence of arthropathy or other focal bone abnormality. Soft tissues are unremarkable. IMPRESSION: No significant abnormality seen in the left shoulder. Electronically Signed   By: Lupita RaiderJames  Green Jr, M.D.   On: 11/19/2017 14:58    Procedures Procedures (including critical care time)  Medications Ordered in UC Medications  ketorolac (TORADOL) injection 60 mg (not administered)     Initial Impression / Assessment and Plan / UC Course  I have reviewed the triage vital signs and the nursing notes.  Pertinent labs & imaging results that were available during my care of the patient were reviewed by me and considered in my medical decision making (see chart for details).     Xray without acute findings at this time. Concern for rotator cuff as possible source of pain. toradol in clinic today. mobic daily. Encouraged follow up with orthopedics for further evaluation and treatment. Patient verbalized understanding and agreeable to plan.    Final Clinical Impressions(s) / UC Diagnoses   Final diagnoses:  Left shoulder pain, unspecified chronicity    ED Discharge Orders        Ordered    meloxicam (MOBIC) 15 MG tablet  Daily     11/19/17 1517       Controlled Substance Prescriptions [place] Controlled Substance Registry consulted? Not Applicable   Georgetta HaberBurky, Natalie B, NP 11/19/17 1525

## 2017-11-19 NOTE — Discharge Instructions (Signed)
Ice application, especially after increased use. See range of motion exercises. Daily meloxicam for pain, do not take additional ibuprofen with this. Please follow up with orthopedics for further evaluation and treatment if symptoms persist or do not improve in the next 2 weeks.

## 2017-11-19 NOTE — ED Triage Notes (Signed)
Pt here for left shoulder pain with radiation and numbness in the left arm. This has been worsening over the last month. She did not injury her shoulder or arm. Hx of bones spurs in the right shoulder. She has been taking ibuprofen and tylenol for the pain. Unable  Very limited ROM with arm.

## 2018-05-20 ENCOUNTER — Other Ambulatory Visit: Payer: Self-pay

## 2018-05-20 ENCOUNTER — Encounter (INDEPENDENT_AMBULATORY_CARE_PROVIDER_SITE_OTHER): Payer: Self-pay | Admitting: Physician Assistant

## 2018-05-20 ENCOUNTER — Ambulatory Visit (INDEPENDENT_AMBULATORY_CARE_PROVIDER_SITE_OTHER): Payer: No Typology Code available for payment source | Admitting: Physician Assistant

## 2018-05-20 VITALS — BP 132/76 | HR 65 | Temp 97.9°F | Ht 65.5 in | Wt 142.8 lb

## 2018-05-20 DIAGNOSIS — Z23 Encounter for immunization: Secondary | ICD-10-CM | POA: Diagnosis not present

## 2018-05-20 DIAGNOSIS — M7502 Adhesive capsulitis of left shoulder: Secondary | ICD-10-CM

## 2018-05-20 MED ORDER — ACETAMINOPHEN-CODEINE #3 300-30 MG PO TABS: 1.0000 | ORAL_TABLET | Freq: Three times a day (TID) | ORAL | 0 refills | Status: DC | PRN

## 2018-05-20 MED ORDER — NAPROXEN 500 MG PO TABS: 500.0000 mg | ORAL_TABLET | Freq: Two times a day (BID) | ORAL | 1 refills | Status: DC

## 2018-05-20 NOTE — Progress Notes (Signed)
Subjective:  Patient ID: Anne Duncan, female    DOB: [birth year]  Age: 57 y.o. MRN: [id number]  CC: neck and LUE pain  HPI Anne Duncan is a 57 y.o. female with a medical history of Sarcoidosis, GERD, and bilateral plantar fasciitis presents with left sided neck pain with radiation to the LUE. Went to the urgent care on 11/19/17 for the same and had an xray done which revealed no significant abnormality. Pt began with pain in the left shoulder but later developed stiffness with limited aROM. Pt still has limited aROM in all planes of motion and notably can not flex or abduct past 90 degrees. Feels some weakness of LUE and has noticed a contracture of the left palm during times of increased pain. Does not report paresthesia, CP, palpitations, SOB, HA, abdominal pain, f/c/n/v, swelling, rash, or GI/GU sxs.        Outpatient Medications Prior to Visit  Medication Sig Dispense Refill  . meloxicam (MOBIC) 15 MG tablet Take 1 tablet (15 mg total) by mouth daily. (Patient not taking: Reported on 05/20/2018) 20 tablet 0  . ergocalciferol (VITAMIN D2) 50000 UNITS capsule Take 50,000 Units by mouth once a week.       No facility-administered medications prior to visit.      ROS Review of Systems  Constitutional: Negative for chills, fever and malaise/fatigue.  Eyes: Negative for blurred vision.  Respiratory: Negative for shortness of breath.   Cardiovascular: Negative for chest pain and palpitations.  Gastrointestinal: Negative for abdominal pain and nausea.  Genitourinary: Negative for dysuria and hematuria.  Musculoskeletal: Positive for joint pain (left shoulder pain). Negative for myalgias.  Skin: Negative for rash.  Neurological: Negative for tingling and headaches.  Psychiatric/Behavioral: Negative for depression. The patient is not nervous/anxious.     Objective:  BP 132/76 (BP Location: Right Arm, Patient Position: Sitting, Cuff Size: Normal)   Pulse 65   Temp 97.9 F (36.6 C)  (Oral)   Ht 5' 5.5" (1.664 m)   Wt 142 lb 12.8 oz (64.8 kg)   SpO2 98%   BMI 23.40 kg/m   BP/Weight 05/20/2018 11/19/2017 06/22/2017  Systolic BP 132 126 119  Diastolic BP 76 83 77  Wt. (Lbs) 142.8 - -  BMI 23.4 - -      Physical Exam  Constitutional: She is oriented to person, place, and time.  Well developed, well nourished, NAD, polite  HENT:  Head: Normocephalic and atraumatic.  Eyes: No scleral icterus.  Neck: Normal range of motion. Neck supple. No thyromegaly present.  Cardiovascular: Normal rate, regular rhythm and normal heart sounds.  Pulmonary/Chest: Effort normal and breath sounds normal.  Musculoskeletal: She exhibits no edema.  Left shoulder with limited aROM and pROM to approximately 80 degress of flexion and abduction. Pain elicited with all planes of motion and provocative testing.   Neurological: She is alert and oriented to person, place, and time.  LUE strength 4/5, RUE strength 5/5  Skin: Skin is warm and dry. No rash noted. No erythema. No pallor.  Psychiatric: She has a normal mood and affect. Her behavior is normal. Thought content normal.  Vitals reviewed.    Assessment & Plan:   1. Adhesive capsulitis of left shoulder - AMB referral to orthopedics  2. Need for Tdap vaccination - Tdap vaccine greater than or equal to 7yo IM  3. Need for prophylactic vaccination and inoculation against influenza - Flu Vaccine QUAD 6+ mos PF IM (Fluarix Quad PF)   Meds ordered this  encounter  Medications  . naproxen (NAPROSYN) 500 MG tablet    Sig: Take 1 tablet (500 mg total) by mouth 2 (two) times daily with a meal.    Dispense:  30 tablet    Refill:  1    Order Specific Question:   Supervising Provider    Answer:   Hoy RegisterNEWLIN, ENOBONG [[id number]]  . acetaminophen-codeine (TYLENOL #3) 300-30 MG tablet    Sig: Take 1 tablet by mouth every 8 (eight) hours as needed for moderate pain.    Dispense:  21 tablet    Refill:  0    Order Specific Question:   Supervising  Provider    Answer:   Hoy RegisterNEWLIN, ENOBONG [[id number]]    Follow-up: Return in about 4 weeks (around 06/17/2018) for annual physical.   Loletta Specteroger David Gomez PA

## 2018-05-20 NOTE — Patient Instructions (Addendum)
Adhesive Capsulitis Adhesive capsulitis is inflammation of the tendons and ligaments that surround the shoulder joint (shoulder capsule). This condition causes the shoulder to become stiff and painful to move. Adhesive capsulitis is also called frozen shoulder. What are the causes? This condition may be caused by:  An injury to the shoulder joint.  Straining the shoulder.  Not moving the shoulder for a period of time. This can happen if your arm was injured or in a sling.  Long-standing health problems, such as: ? Diabetes. ? Thyroid problems. ? Heart disease. ? Stroke. ? Rheumatoid arthritis. ? Lung disease.  In some cases, the cause may not be known. What increases the risk? This condition is more likely to develop in:  Women.  People who are older than 56 years of age.  What are the signs or symptoms? Symptoms of this condition include:  Pain in the shoulder when moving the arm. There may also be pain when parts of the shoulder are touched. The pain is worse at night or when at rest.  Soreness or aching in the shoulder.  Inability to move the shoulder normally.  Muscle spasms.  How is this diagnosed? This condition is diagnosed with a physical exam and imaging tests, such as an X-ray or MRI. How is this treated? This condition may be treated with:  Treatment of the underlying cause or condition.  Physical therapy. This involves performing exercises to get the shoulder moving again.  Medicine. Medicine may be given to relieve pain, inflammation, or muscle spasms.  Steroid injections into the shoulder joint.  Shoulder manipulation. This is a procedure to move the shoulder into another position. It is done after you are given a medicine to make you fall asleep (general anesthetic). The joint may also be injected with salt water at high pressure to break down scarring.  Surgery. This may be done in severe cases when other treatments have failed.  Although most  people recover completely from adhesive capsulitis, some may not regain the full movement of the shoulder. Follow these instructions at home:  Take over-the-counter and prescription medicines only as told by your health care provider.  If you are being treated with physical therapy, follow instructions from your physical therapist.  Avoid exercises that put a lot of demand on your shoulder, such as throwing. These exercises can make pain worse.  If directed, apply ice to the injured area: ? Put ice in a plastic bag. ? Place a towel between your skin and the bag. ? Leave the ice on for 20 minutes, 2-3 times per day. Contact a health care provider if:  You develop new symptoms.  Your symptoms get worse. This information is not intended to replace advice given to you by your health care provider. Make sure you discuss any questions you have with your health care provider. Document Released: 06/22/2009 Document Revised: 01/31/2016 Document Reviewed: 12/18/2014 Elsevier Interactive Patient Education  2018 [employer]  

## 2018-05-27 ENCOUNTER — Encounter

## 2018-06-03 ENCOUNTER — Ambulatory Visit (INDEPENDENT_AMBULATORY_CARE_PROVIDER_SITE_OTHER): Payer: No Typology Code available for payment source | Admitting: Orthopaedic Surgery

## 2018-06-03 ENCOUNTER — Encounter (INDEPENDENT_AMBULATORY_CARE_PROVIDER_SITE_OTHER): Payer: Self-pay | Admitting: Orthopaedic Surgery

## 2018-06-03 DIAGNOSIS — M7502 Adhesive capsulitis of left shoulder: Secondary | ICD-10-CM

## 2018-06-03 MED ORDER — CELECOXIB 200 MG PO CAPS: 200.0000 mg | ORAL_CAPSULE | Freq: Two times a day (BID) | ORAL | 3 refills | Status: DC

## 2018-06-03 NOTE — Progress Notes (Signed)
Office Visit Note   Patient: Anne Duncan           Date of Birth: 14-Dec-1960           MRN: [id number] Visit Date: 06/03/2018              Requested by: Loletta Specter, PA-C [address] [place], [place] [address] PCP: Loletta Specter, PA-C   Assessment & Plan: Visit Diagnoses:  1. Adhesive capsulitis of left shoulder     Plan: Impression is left shoulder adhesive capsulitis.  She has not tried any physical therapy or cortisone injections.  We did set her up for this.  I would like to see her back in 6 weeks for recheck and a repeat cortisone injection.  Prescription for Celebrex.  I did give her handout on frozen shoulder today.  Follow-Up Instructions: Return in about 6 weeks (around 07/15/2018).   Orders:  No orders of the defined types were placed in this encounter.  Meds ordered this encounter  Medications  . celecoxib (CELEBREX) 200 MG capsule    Sig: Take 1 capsule (200 mg total) by mouth 2 (two) times daily.    Dispense:  30 capsule    Refill:  3      Procedures: No procedures performed   Clinical Data: No additional findings.   Subjective: Chief Complaint  Patient presents with  . Left Shoulder - Pain    [name] is a very pleasant right-hand-dominant 57 year old female who has had 6 months of left shoulder pain and stiffness of insidious onset.  She works at a rehab facility.  She denies any injuries.  Denies any radicular symptoms.  She has constant pain in her right shoulder with significant limitation in activity.   Review of Systems  Constitutional: Negative.   HENT: Negative.   Eyes: Negative.   Respiratory: Negative.   Cardiovascular: Negative.   Endocrine: Negative.   Musculoskeletal: Negative.   Neurological: Negative.   Hematological: Negative.   Psychiatric/Behavioral: Negative.   All other systems reviewed and are negative.    Objective: Vital Signs: There were no vitals taken for this visit.  Physical Exam    Constitutional: She is oriented to person, place, and time. She appears well-developed and well-nourished.  HENT:  Head: Normocephalic and atraumatic.  Eyes: EOM are normal.  Neck: Neck supple.  Pulmonary/Chest: Effort normal.  Abdominal: Soft.  Neurological: She is alert and oriented to person, place, and time.  Skin: Skin is warm. Capillary refill takes less than 2 seconds.  Psychiatric: She has a normal mood and affect. Her behavior is normal. Judgment and thought content normal.  Nursing note and vitals reviewed.   Ortho Exam Left shoulder exam shows significant limitation for flexion external rotation and internal rotation.  Her rotator cuff testing is grossly intact. Specialty Comments:  No specialty comments available.  Imaging: No results found.   PMFS History: Patient Active Problem List   Diagnosis Date Noted  . Costochondritis 08/13/2012  . GASTRITIS 09/21/2008  . GERD 07/19/2008  . SARCOIDOSIS 07/18/2008  . DYSPNEA 07/18/2008   Past Medical History:  Diagnosis Date  . Dyspnea   . GERD (gastroesophageal reflux disease)   . Sarcoidosis     Family History  Problem Relation Age of Onset  . Asthma Sister     Past Surgical History:  Procedure Laterality Date  . LAPAROSCOPY    . removal bone spurs right shoulder    . tubal ligation and reversal  Social History   Occupational History  . Not on file  Tobacco Use  . Smoking status: Former Smoker    Packs/day: 0.10    Years: 12.00    Pack years: 1.20    Types: Cigarettes    Last attempt to quit: 07/22/1995    Years since quitting: 22.8  . Smokeless tobacco: Never Used  Substance and Sexual Activity  . Alcohol use: No  . Drug use: No  . Sexual activity: Not on file

## 2018-06-03 NOTE — Progress Notes (Signed)
Subjective: She is here with left shoulder adhesive capsulitis, for an ultrasound-guided glenohumeral injection per Dr. Roda Shutters.  Objective: Very limited active and passive range of motion with abduction 45 degrees, external rotation 45 degrees and internal rotation 45 degrees.  Pain at the extremes of each of these.  Procedure: Ultrasound-guided glenohumeral injection: After sterile prep with Betadine, injected 8 cc 1% lidocaine without epinephrine and 40 mg methylprednisolone from posterior approach into the glenohumeral joint using ultrasound to guide needle placement.  Injectate was seen filling the joint capsule.  Post procedure she had excellent pain relief and slightly improved range of motion.  She will continue with home exercises, use ice applications, and follow-up with Dr. Roda Shutters as directed.

## 2018-06-17 ENCOUNTER — Other Ambulatory Visit: Payer: Self-pay

## 2018-06-17 ENCOUNTER — Ambulatory Visit (INDEPENDENT_AMBULATORY_CARE_PROVIDER_SITE_OTHER): Payer: No Typology Code available for payment source | Admitting: Physician Assistant

## 2018-06-17 ENCOUNTER — Encounter (INDEPENDENT_AMBULATORY_CARE_PROVIDER_SITE_OTHER): Payer: Self-pay | Admitting: Physician Assistant

## 2018-06-17 ENCOUNTER — Other Ambulatory Visit (HOSPITAL_COMMUNITY)
Admission: RE | Admit: 2018-06-17 | Discharge: 2018-06-17 | Disposition: A | Discharge status: Home / Self Care | Payer: PRIVATE HEALTH INSURANCE | Source: Ambulatory Visit | Attending: Physician Assistant | Admitting: Physician Assistant

## 2018-06-17 VITALS — BP 108/74 | HR 71 | Temp 98.0°F | Ht 65.5 in | Wt 144.6 lb

## 2018-06-17 DIAGNOSIS — Z1239 Encounter for other screening for malignant neoplasm of breast: Secondary | ICD-10-CM

## 2018-06-17 DIAGNOSIS — Z1159 Encounter for screening for other viral diseases: Secondary | ICD-10-CM | POA: Diagnosis not present

## 2018-06-17 DIAGNOSIS — M25561 Pain in right knee: Secondary | ICD-10-CM

## 2018-06-17 DIAGNOSIS — R202 Paresthesia of skin: Secondary | ICD-10-CM

## 2018-06-17 DIAGNOSIS — G8929 Other chronic pain: Secondary | ICD-10-CM

## 2018-06-17 DIAGNOSIS — Z124 Encounter for screening for malignant neoplasm of cervix: Secondary | ICD-10-CM | POA: Insufficient documentation

## 2018-06-17 DIAGNOSIS — Z114 Encounter for screening for human immunodeficiency virus [HIV]: Secondary | ICD-10-CM

## 2018-06-17 DIAGNOSIS — Z0000 Encounter for general adult medical examination without abnormal findings: Secondary | ICD-10-CM

## 2018-06-17 DIAGNOSIS — R5383 Other fatigue: Secondary | ICD-10-CM

## 2018-06-17 MED ORDER — NAPROXEN 500 MG PO TABS: 500.0000 mg | ORAL_TABLET | Freq: Two times a day (BID) | ORAL | 1 refills | Status: DC

## 2018-06-17 MED ORDER — CALTRATE 600+D PLUS MINERALS 600-800 MG-UNIT PO TABS: 2.0000 | ORAL_TABLET | Freq: Every day | ORAL | 5 refills | Status: DC

## 2018-06-17 NOTE — Patient Instructions (Signed)

## 2018-06-17 NOTE — Progress Notes (Signed)
Subjective:  Patient ID: Anne Duncan, female    DOB: [birth year]  Age: 57 y.o. MRN: [id number]  CC: annual exam  HPI Anne Duncan is a 57 y.o. female with a medical history of Sarcoidosis, GERD, adhesive capsulitis of left shoulder, right CTS, and bilateral plantar fasciitis presents for an annual physical exam. States she is generally well except for some mild fatigue attributed to work. She is also receiving treatment with orthopedics for adhesive capsulitis of the left shoulder. Has occasional mild right knee pain but is well for the most part. Endorses some left forearm and hand numbness possibly due to CTS. Had treatment for right CTS previously. Does not endorse any other symptoms or complaints.       Outpatient Medications Prior to Visit  Medication Sig Dispense Refill  . celecoxib (CELEBREX) 200 MG capsule Take 1 capsule (200 mg total) by mouth 2 (two) times daily. 30 capsule 3  . meloxicam (MOBIC) 15 MG tablet Take 1 tablet (15 mg total) by mouth daily. 20 tablet 0  . naproxen (NAPROSYN) 500 MG tablet Take 1 tablet (500 mg total) by mouth 2 (two) times daily with a meal. 30 tablet 1  . acetaminophen-codeine (TYLENOL #3) 300-30 MG tablet Take 1 tablet by mouth every 8 (eight) hours as needed for moderate pain. 21 tablet 0   No facility-administered medications prior to visit.      ROS Review of Systems  Constitutional: Positive for malaise/fatigue. Negative for chills and fever.  Eyes: Negative for blurred vision.  Respiratory: Negative for shortness of breath.   Cardiovascular: Negative for chest pain and palpitations.  Gastrointestinal: Negative for abdominal pain and nausea.  Genitourinary: Negative for dysuria and hematuria.  Musculoskeletal: Positive for joint pain. Negative for myalgias.  Skin: Negative for rash.  Neurological: Negative for tingling and headaches.       Numbness of left forearm and hand  Psychiatric/Behavioral: Negative for depression. The patient  is not nervous/anxious.     Objective:  BP 108/74 (BP Location: Left Arm, Patient Position: Sitting, Cuff Size: Normal)   Pulse 71   Temp 98 F (36.7 C) (Oral)   Ht 5' 5.5" (1.664 m)   Wt 144 lb 9.6 oz (65.6 kg)   SpO2 97%   BMI 23.70 kg/m   BP/Weight 06/17/2018 05/20/2018 11/19/2017  Systolic BP 108 132 126  Diastolic BP 74 76 83  Wt. (Lbs) 144.6 142.8 -  BMI 23.7 23.4 -      Physical Exam  Constitutional: She is oriented to person, place, and time.  Well developed, well nourished, NAD, polite  HENT:  Head: Normocephalic and atraumatic.  Mouth/Throat: No oropharyngeal exudate.  Eyes: Pupils are equal, round, and reactive to light. Conjunctivae and EOM are normal. No scleral icterus.  Neck: Normal range of motion. Neck supple. No thyromegaly present.  Cardiovascular: Normal rate, regular rhythm and normal heart sounds.  No LE edema bilaterally. No carotid bruits bilaterally  Pulmonary/Chest: Effort normal and breath sounds normal.  Abdominal: Soft. Bowel sounds are normal. She exhibits no distension and no mass. There is no tenderness. There is no guarding.  Liver somewhat firm but edges smooth and rounded  Genitourinary:  Genitourinary Comments: Atrophic vaginal walls. No vaginal discharge. Cervix non-tender, no cysts, no inflammation. No adnexal tenderness or mass bilaterally. No uterine mass or tenderness.   Musculoskeletal: She exhibits no edema, tenderness or deformity.  Full range of motion throughout  Lymphadenopathy:    She has no cervical adenopathy.  Neurological: She  is alert and oriented to person, place, and time. No cranial nerve deficit. Coordination normal.  Left biceps strength 4/5, left hand grip strength 5/5. Altered light touch sensation of the left forearm. Otherwise strength 5/5 throughout. Normal gait.   Skin: Skin is warm and dry. No rash noted. No erythema. No pallor.  Psychiatric: She has a normal mood and affect. Her behavior is normal. Thought  content normal.  Vitals reviewed.    Assessment & Plan:   1. Annual physical exam - CBC with Differential - Comprehensive metabolic panel - TSH - Lipid panel  2. Screening for breast cancer - MM DIGITAL SCREENING BILATERAL; Future  3. Screening for cervical cancer - Cytology - PAP([place])  4. Need for hepatitis C screening test - Hepatitis c antibody (reflex)  5. Screening for HIV (human immunodeficiency virus) - HIV Antibody (routine testing w rflx)  6. Fatigue, unspecified type - Begin Calcium Carbonate-Vit D-Min (CALTRATE 600+D PLUS MINERALS) 600-800 MG-UNIT TABS; Take 2 tablets by mouth daily.  Dispense: 60 tablet; Refill: 5 - TSH, CBC, CMP  7. Chronic pain of right knee - Begin naproxen (NAPROSYN) 500 MG tablet; Take 1 tablet (500 mg total) by mouth 2 (two) times daily with a meal.  Dispense: 30 tablet; Refill: 1  8. Paresthesia of left arm - Ambulatory referral to Neurology - Beginnaproxen (NAPROSYN) 500 MG tablet; Take 1 tablet (500 mg total) by mouth 2 (two) times daily with a meal.  Dispense: 30 tablet; Refill: 1   Meds ordered this encounter  Medications  . Calcium Carbonate-Vit D-Min (CALTRATE 600+D PLUS MINERALS) 600-800 MG-UNIT TABS    Sig: Take 2 tablets by mouth daily.    Dispense:  60 tablet    Refill:  5    Order Specific Question:   Supervising Provider    Answer:   Hoy Register [[id number]]  . naproxen (NAPROSYN) 500 MG tablet    Sig: Take 1 tablet (500 mg total) by mouth 2 (two) times daily with a meal.    Dispense:  30 tablet    Refill:  1    Order Specific Question:   Supervising Provider    Answer:   Hoy Register [[id number]]    Follow-up: Return in about 1 year (around 06/18/2019) for annual physical.   Loletta Specter PA

## 2018-06-18 ENCOUNTER — Other Ambulatory Visit (INDEPENDENT_AMBULATORY_CARE_PROVIDER_SITE_OTHER): Payer: Self-pay | Admitting: Physician Assistant

## 2018-06-18 ENCOUNTER — Telehealth (INDEPENDENT_AMBULATORY_CARE_PROVIDER_SITE_OTHER): Payer: Self-pay

## 2018-06-18 DIAGNOSIS — Z1231 Encounter for screening mammogram for malignant neoplasm of breast: Secondary | ICD-10-CM

## 2018-06-18 LAB — LIPID PANEL
CHOLESTEROL TOTAL: 180 mg/dL (ref 100–199)
Chol/HDL Ratio: 2.4 ratio (ref 0.0–4.4)
HDL: 75 mg/dL (ref >39)
LDL CALC: 95 mg/dL (ref 0–99)
Triglycerides: 48 mg/dL (ref 0–149)
VLDL CHOLESTEROL CAL: 10 mg/dL (ref 5–40)

## 2018-06-18 LAB — CBC WITH DIFFERENTIAL/PLATELET
BASOS ABS: 0 10*3/uL (ref 0.0–0.2)
Basos: 0 %
EOS (ABSOLUTE): 0.1 10*3/uL (ref 0.0–0.4)
Eos: 2 %
Hematocrit: 36.2 % (ref 34.0–46.6)
Hemoglobin: 11.4 g/dL (ref 11.1–15.9)
IMMATURE GRANULOCYTES: 0 %
Immature Grans (Abs): 0 10*3/uL (ref 0.0–0.1)
Lymphocytes Absolute: 2.1 10*3/uL (ref 0.7–3.1)
Lymphs: 42 %
MCH: 28.3 pg (ref 26.6–33.0)
MCHC: 31.5 g/dL (ref 31.5–35.7)
MCV: 90 fL (ref 79–97)
MONOS ABS: 0.4 10*3/uL (ref 0.1–0.9)
Monocytes: 8 %
NEUTROS PCT: 48 %
Neutrophils Absolute: 2.5 10*3/uL (ref 1.4–7.0)
Platelets: 281 10*3/uL (ref 150–450)
RBC: 4.03 x10E6/uL (ref 3.77–5.28)
RDW: 12.7 % (ref 12.3–15.4)
WBC: 5.1 10*3/uL (ref 3.4–10.8)

## 2018-06-18 LAB — COMPREHENSIVE METABOLIC PANEL
ALT: 12 IU/L (ref 0–32)
AST: 15 IU/L (ref 0–40)
Albumin/Globulin Ratio: 1.8 (ref 1.2–2.2)
Albumin: 4.3 g/dL (ref 3.5–5.5)
Alkaline Phosphatase: 63 IU/L (ref 39–117)
BUN/Creatinine Ratio: 13 (ref 9–23)
BUN: 10 mg/dL (ref 6–24)
Bilirubin Total: 0.3 mg/dL (ref 0.0–1.2)
CALCIUM: 9.5 mg/dL (ref 8.7–10.2)
CO2: 25 mmol/L (ref 20–29)
Chloride: 104 mmol/L (ref 96–106)
Creatinine, Ser: 0.79 mg/dL (ref 0.57–1.00)
GFR calc Af Amer: 97 mL/min/{1.73_m2} (ref >59)
GFR, EST NON AFRICAN AMERICAN: 84 mL/min/{1.73_m2} (ref >59)
Globulin, Total: 2.4 g/dL (ref 1.5–4.5)
Glucose: 89 mg/dL (ref 65–99)
POTASSIUM: 4 mmol/L (ref 3.5–5.2)
Sodium: 143 mmol/L (ref 134–144)
Total Protein: 6.7 g/dL (ref 6.0–8.5)

## 2018-06-18 LAB — TSH: TSH: 1.09 u[IU]/mL (ref 0.450–4.500)

## 2018-06-18 LAB — HCV COMMENT:

## 2018-06-18 LAB — HIV ANTIBODY (ROUTINE TESTING W REFLEX): HIV SCREEN 4TH GENERATION: NONREACTIVE

## 2018-06-18 LAB — HEPATITIS C ANTIBODY (REFLEX)

## 2018-06-18 NOTE — Telephone Encounter (Signed)
Left patient a voicemail notifying that all labs are normal. Tempestt Budd Palmer, CMA

## 2018-06-18 NOTE — Telephone Encounter (Signed)
-----   Message from Loletta Specter, PA-C sent at 06/18/2018  1:09 PM EDT ----- All labs normal.

## 2018-06-21 ENCOUNTER — Telehealth (INDEPENDENT_AMBULATORY_CARE_PROVIDER_SITE_OTHER): Payer: Self-pay

## 2018-06-21 LAB — CYTOLOGY - PAP
Bacterial vaginitis: NEGATIVE
CHLAMYDIA, DNA PROBE: NEGATIVE
Candida vaginitis: NEGATIVE
DIAGNOSIS: NEGATIVE
NEISSERIA GONORRHEA: NEGATIVE
TRICH (WINDOWPATH): NEGATIVE

## 2018-06-21 NOTE — Telephone Encounter (Signed)
Left voicemail notifying patient that pap is negative. Maryjean Morn, CMA

## 2018-06-21 NOTE — Telephone Encounter (Signed)
-----   Message from Loletta Specter, PA-C sent at 06/21/2018  4:55 PM EDT ----- PAP completely negative.

## 2018-07-07 ENCOUNTER — Telehealth (INDEPENDENT_AMBULATORY_CARE_PROVIDER_SITE_OTHER): Payer: Self-pay | Admitting: Physician Assistant

## 2018-07-07 NOTE — Telephone Encounter (Signed)
Open in error

## 2018-07-20 ENCOUNTER — Ambulatory Visit: Payer: No Typology Code available for payment source

## 2018-07-26 ENCOUNTER — Ambulatory Visit (INDEPENDENT_AMBULATORY_CARE_PROVIDER_SITE_OTHER): Payer: No Typology Code available for payment source | Admitting: Physician Assistant

## 2018-08-11 ENCOUNTER — Ambulatory Visit (INDEPENDENT_AMBULATORY_CARE_PROVIDER_SITE_OTHER): Payer: No Typology Code available for payment source | Admitting: Diagnostic Neuroimaging

## 2018-08-11 ENCOUNTER — Encounter: Payer: Self-pay | Admitting: Diagnostic Neuroimaging

## 2018-08-11 VITALS — BP 128/70 | HR 80 | Ht 66.0 in | Wt 148.8 lb

## 2018-08-11 DIAGNOSIS — R29898 Other symptoms and signs involving the musculoskeletal system: Secondary | ICD-10-CM | POA: Diagnosis not present

## 2018-08-11 DIAGNOSIS — M79602 Pain in left arm: Secondary | ICD-10-CM

## 2018-08-11 NOTE — Progress Notes (Signed)
[facility]  PATIENT: Anne Duncan DOB: March 20, 1961  REFERRING CLINICIAN: Dewaine Oats HISTORY FROM: patient  REASON FOR VISIT: new consult    HISTORICAL  CHIEF COMPLAINT:  Chief Complaint  Patient presents with  . Paresthesia of left arm    rm 7, New Pt, "left hand weakness and pain x 1 year"    HISTORY OF PRESENT ILLNESS:   57 year old female here for evaluation of left hand pain and weakness.  History of pulmonary sarcoidosis.  In 2017 patient had some left wrist pain. In 2018 patient has had left shoulder pain.  She was diagnosed with adhesive capsulitis.  She had an injection several months ago.  Over time patient has continued to have problems with her left arm and hand.  Now has some left hand weakness, pain, numbness.  No signal problems with right arm.  No problems with feet or legs.  No prodromal, triggering or aggravating factors.    REVIEW OF SYSTEMS: Full 14 system review of systems performed and negative with exception of: Numbness weakness restless legs.  ALLERGIES: Allergies  Allergen Reactions  . Penicillins     REACTION: intolerant    HOME MEDICATIONS: Outpatient Medications Prior to Visit  Medication Sig Dispense Refill  . Calcium Carbonate-Vit D-Min (CALTRATE 600+D PLUS MINERALS) 600-800 MG-UNIT TABS Take 2 tablets by mouth daily. 60 tablet 5  . celecoxib (CELEBREX) 200 MG capsule Take 1 capsule (200 mg total) by mouth 2 (two) times daily. 30 capsule 3  . naproxen (NAPROSYN) 500 MG tablet Take 1 tablet (500 mg total) by mouth 2 (two) times daily with a meal. 30 tablet 1   No facility-administered medications prior to visit.     PAST MEDICAL HISTORY: Past Medical History:  Diagnosis Date  . Dyspnea   . GERD (gastroesophageal reflux disease)   . Sarcoidosis     PAST SURGICAL HISTORY: Past Surgical History:  Procedure Laterality Date  . LAPAROSCOPY    . removal bone spurs right shoulder    . tubal ligation and reversal        FAMILY HISTORY: Family History  Problem Relation Age of Onset  . Asthma Sister     SOCIAL HISTORY: Social History   Socioeconomic History  . Marital status: Married    Spouse name: Not on file  . Number of children: 0  . Years of education: 97  . Highest education level: Not on file  Occupational History    Comment: [employer]  Social Needs  . Financial resource strain: Not on file  . Food insecurity:    Worry: Not on file    Inability: Not on file  . Transportation needs:    Medical: Not on file    Non-medical: Not on file  Tobacco Use  . Smoking status: Former Smoker    Packs/day: 0.10    Years: 12.00    Pack years: 1.20    Types: Cigarettes    Last attempt to quit: 07/22/1995    Years since quitting: 23.0  . Smokeless tobacco: Never Used  Substance and Sexual Activity  . Alcohol use: No  . Drug use: No  . Sexual activity: Not on file  Lifestyle  . Physical activity:    Days per week: Not on file    Minutes per session: Not on file  . Stress: Not on file  Relationships  . Social connections:    Talks on phone: Not on file    Gets together: Not  on file    Attends religious service: Not on file    Active member of club or organization: Not on file    Attends meetings of clubs or organizations: Not on file    Relationship status: Not on file  . Intimate partner violence:    Fear of current or ex partner: Not on file    Emotionally abused: Not on file    Physically abused: Not on file    Forced sexual activity: Not on file  Other Topics Concern  . Not on file  Social History Narrative   Married, lives with husband   08/11/18 Works at [employer] as a Scientist, clinical (histocompatibility and immunogenetics)   No children   No recent travel     PHYSICAL EXAM  GENERAL EXAM/CONSTITUTIONAL: Vitals:  Vitals:   08/11/18 0835  BP: 128/70  Pulse: 80  Weight: 148 lb 12.8 oz (67.5 kg)  Height: 5\' 6"  (1.676 m)     Body mass index is 24.02 kg/m. Wt Readings  from Last 3 Encounters:  08/11/18 148 lb 12.8 oz (67.5 kg)  06/17/18 144 lb 9.6 oz (65.6 kg)  05/20/18 142 lb 12.8 oz (64.8 kg)     Patient is in no distress; well developed, nourished and groomed; neck is supple  CARDIOVASCULAR:  Examination of carotid arteries is normal; no carotid bruits  Regular rate and rhythm, no murmurs  Examination of peripheral vascular system by observation and palpation is normal  EYES:  Ophthalmoscopic exam of optic discs and posterior segments is normal; no papilledema or hemorrhages  Visual Acuity Screening   Right eye Left eye Both eyes  Without correction:     With correction: 20/50 20/30      MUSCULOSKELETAL:  Gait, strength, tone, movements noted in Neurologic exam below  NEUROLOGIC: MENTAL STATUS:  No flowsheet data found.  awake, alert, oriented to person, place and time  recent and remote memory intact  normal attention and concentration  language fluent, comprehension intact, naming intact  fund of knowledge appropriate  CRANIAL NERVE:   2nd - no papilledema on fundoscopic exam  2nd, 3rd, 4th, 6th - pupils equal and reactive to light, visual fields full to confrontation, extraocular muscles intact, no nystagmus  5th - facial sensation symmetric  7th - facial strength symmetric  8th - hearing intact  9th - palate elevates symmetrically, uvula midline  11th - shoulder shrug symmetric  12th - tongue protrusion midline  MOTOR:   normal bulk and tone, full strength in the BUE, BLE; EXCEPT LEFT DELTOID 4 (LIMITED BY PAIN) AND FINGER ABDUCTION (4)  SENSORY:   normal and symmetric to light touch, pinprick, temperature, vibration; EXCEPT DECR IN LEFT DISTAL / LATERAL FOREARM AND HAND (DIGITS 1-3)  COORDINATION:   finger-nose-finger, fine finger movements normal  REFLEXES:   deep tendon reflexes TRACE and symmetric  GAIT/STATION:   narrow based gait     DIAGNOSTIC DATA (LABS, IMAGING, TESTING) - I  reviewed patient records, labs, notes, testing and imaging myself where available.  Lab Results  Component Value Date   WBC 5.1 06/17/2018   HGB 11.4 06/17/2018   HCT 36.2 06/17/2018   MCV 90 06/17/2018   PLT 281 06/17/2018      Component Value Date/Time   NA 143 06/17/2018 0947   K 4.0 06/17/2018 0947   CL 104 06/17/2018 0947   CO2 25 06/17/2018 0947   GLUCOSE 89 06/17/2018 0947   GLUCOSE 93 10/10/2015 1528   BUN 10 06/17/2018 0947  CREATININE 0.79 06/17/2018 0947   CALCIUM 9.5 06/17/2018 0947   PROT 6.7 06/17/2018 0947   ALBUMIN 4.3 06/17/2018 0947   AST 15 06/17/2018 0947   ALT 12 06/17/2018 0947   ALKPHOS 63 06/17/2018 0947   BILITOT 0.3 06/17/2018 0947   GFRNONAA 84 06/17/2018 0947   GFRAA 97 06/17/2018 0947   Lab Results  Component Value Date   CHOL 180 06/17/2018   HDL 75 06/17/2018   LDLCALC 95 06/17/2018   TRIG 48 06/17/2018   CHOLHDL 2.4 06/17/2018   No results found for: HGBA1C No results found for: VITAMINB12 Lab Results  Component Value Date   TSH 1.090 06/17/2018    11/18/05 xray left wrist:   1. Normal alignment. No acute bony findings.   2. Minimal degenerative changes at C5-6.  07/07/17 EMG/NCV (Dr. Loistine Simas Patel) - This is a normal study of the lower extremities. In particular, there is no evidence of tarsal tunnel syndrome, sensorimotor polyneuropathy, or lumbosacral radiculopathy.    ASSESSMENT AND PLAN  57 y.o. year old female here with here for evaluation of left upper extremity numbness, pain, weakness.  We will proceed with further work-up.   Ddx: left cervical radiculopathies (C5-C6; C8-T1), left brachial plexopathy, left median neuropathy, left ulnar neuropathy  1. Left arm pain   2. Left hand weakness      PLAN:  - MRI cervical spine (eval for left cervical radiculopathy) - EMG/NCV (eval for peripheral neuropathy; median and ulnar neuropathies) - in future may consider rheumatology evaluation (for wrist pain; h/o  sarcoidosis)  Orders Placed This Encounter  Procedures  . MR CERVICAL SPINE W WO CONTRAST  . NCV with EMG(electromyography)   Return for for NCV/EMG.    Suanne MarkerVIKRAM R. PENUMALLI, MD 08/11/2018, 8:46 AM Certified in Neurology, Neurophysiology and Neuroimaging  [facility] [address], [address] [place], [place] [address] [phone]

## 2018-08-11 NOTE — Patient Instructions (Signed)
-   check MRI and EMG

## 2018-08-12 ENCOUNTER — Ambulatory Visit (INDEPENDENT_AMBULATORY_CARE_PROVIDER_SITE_OTHER): Payer: No Typology Code available for payment source | Admitting: Diagnostic Neuroimaging

## 2018-08-12 ENCOUNTER — Encounter (INDEPENDENT_AMBULATORY_CARE_PROVIDER_SITE_OTHER): Payer: No Typology Code available for payment source | Admitting: Diagnostic Neuroimaging

## 2018-08-12 ENCOUNTER — Telehealth: Payer: Self-pay | Admitting: Diagnostic Neuroimaging

## 2018-08-12 DIAGNOSIS — R29898 Other symptoms and signs involving the musculoskeletal system: Secondary | ICD-10-CM

## 2018-08-12 DIAGNOSIS — M79602 Pain in left arm: Secondary | ICD-10-CM

## 2018-08-12 DIAGNOSIS — Z0289 Encounter for other administrative examinations: Secondary | ICD-10-CM

## 2018-08-12 NOTE — Telephone Encounter (Signed)
[employer] auth: NPR Ref # [id number] on 08/12/18 at 2:34 pm order sent to GI. They will reach out to the pt to schedule.

## 2018-08-17 NOTE — Procedures (Signed)
   [facility]  NCS (NERVE CONDUCTION STUDY) WITH EMG (ELECTROMYOGRAPHY) REPORT   STUDY DATE: 08/12/18 PATIENT NAME: Anne Duncan DOB: 31-Jan-1961 MRN: [id number]  ORDERING CLINICIAN: Joycelyn SchmidVikram Jamicheal Heard, MD   TECHNOLOGIST: Charlesetta IvoryBeau Handy ELECTROMYOGRAPHER: Glenford BayleyVikram R. Braedyn Riggle, MD  CLINICAL INFORMATION: 57 year old female with left arm numbness.  FINDINGS: NERVE CONDUCTION STUDY:  Left median, left ulnar motor responses are normal.  Left radial, left median, left ulnar sensory responses are normal.   NEEDLE ELECTROMYOGRAPHY: Needle examination of left upper extremity deltoid, biceps, triceps, flexor carpi radialis, first dorsal interosseous, left cervical paraspinal muscles is normal.  IMPRESSION:   This is a normal study.  No electrodiagnostic evidence of large fiber neuropathy or left cervical radiculopathy at this time.     INTERPRETING PHYSICIAN:  Suanne MarkerVIKRAM R. Luman Holway, MD Certified in Neurology, Neurophysiology and Neuroimaging  [facility] [address], [address] [place], [place] [address] [phone]   [facility]    Nerve / Sites Muscle Latency Ref. Amplitude Ref. Rel Amp Segments Distance Velocity Ref. Area    ms ms mV mV %  cm m/s m/s mVms  L Median - APB     Wrist APB 3.0 ?4.4 11.3 ?4.0 100 Wrist - APB 7   43.0     Upper arm APB 6.6  11.0  96.9 Upper arm - Wrist 22 61 ?49 42.4  L Ulnar - ADM     Wrist ADM 2.4 ?3.3 8.1 ?6.0 100 Wrist - ADM 7   31.1     B.Elbow ADM 5.8  8.4  104 B.Elbow - Wrist 20 59 ?49 29.1     A.Elbow ADM 7.0  8.4  99.6 A.Elbow - B.Elbow 8 67 ?49 31.9         A.Elbow - Wrist             SNC    Nerve / Sites Rec. Site Peak Lat Ref.  Amp Ref. Segments Distance    ms ms V V  cm  L Radial - Anatomical snuff box (Forearm)     Forearm Wrist 2.5 ?2.9 28 ?15 Forearm - Wrist 10  L Median - Orthodromic (Dig II, Mid palm)     Dig II Wrist 3.0 ?3.4 20 ?10 Dig II - Wrist 13  L Ulnar - Orthodromic, (Dig V, Mid palm)       Dig V Wrist 2.8 ?3.1 14 ?5 Dig V - Wrist 5711           F  Wave    Nerve F Lat Ref.   ms ms  L Ulnar - ADM 25.2 ?32.0       EMG full       EMG Summary Table    Spontaneous MUAP Recruitment  Muscle IA Fib PSW Fasc Other Amp Dur. Poly Pattern  L. Deltoid Normal None None None _______ Normal Normal Normal Normal  L. Biceps brachii Normal None None None _______ Normal Normal Normal Normal  L. Triceps brachii Normal None None None _______ Normal Normal Normal Normal  L. Flexor carpi radialis Normal None None None _______ Normal Normal Normal Normal  L. First dorsal interosseous Normal None None None _______ Normal Normal Normal Normal  L. Cervical paraspinals Normal None None None _______ Normal Normal Normal Normal

## 2019-08-17 ENCOUNTER — Telehealth: Payer: Self-pay | Admitting: Internal Medicine

## 2019-08-17 NOTE — Telephone Encounter (Signed)
Spoke with the pt  She states having SOB x 1 wk  No to any other symptoms Works in a covid unit  She states that her SOB is worse with exertion when she works on the floor  She is not wanting appt but requesting a note from [place] just stating that she has a lung dz  She feels that her SOB is related to wearing a mask  She is aware we will not provide letter excusing her from wearing mask  Last seen 2017  Please advise thanks

## 2019-08-18 NOTE — Telephone Encounter (Signed)
LMTCB

## 2019-08-18 NOTE — Telephone Encounter (Signed)
We have not seen her for close to 3 years so cannot give a letter about her respiratory status. She can schedule a visit to reestablish care.

## 2019-08-19 NOTE — Telephone Encounter (Signed)
LMTCB x2 for pt 

## 2019-08-22 NOTE — Telephone Encounter (Signed)
Called pt and advised message from the provider. Pt understood and verbalized understanding. Nothing further is needed.   She will call back to schedule an appt, needs to be a 30 min visit with Dr. Vaughan Browner.

## 2019-08-23 NOTE — Telephone Encounter (Signed)
Pt has been scheduled with Dr. Vaughan Browner on 08/25/2019. Nothing further needed at this time.

## 2019-08-25 ENCOUNTER — Ambulatory Visit (INDEPENDENT_AMBULATORY_CARE_PROVIDER_SITE_OTHER): Payer: No Typology Code available for payment source

## 2019-08-25 ENCOUNTER — Ambulatory Visit (INDEPENDENT_AMBULATORY_CARE_PROVIDER_SITE_OTHER): Payer: No Typology Code available for payment source | Admitting: Pulmonary Disease

## 2019-08-25 ENCOUNTER — Encounter: Payer: Self-pay | Admitting: Pulmonary Disease

## 2019-08-25 ENCOUNTER — Other Ambulatory Visit: Payer: Self-pay

## 2019-08-25 VITALS — BP 120/82 | HR 75 | Temp 97.6°F | Ht 66.0 in | Wt 147.2 lb

## 2019-08-25 DIAGNOSIS — D869 Sarcoidosis, unspecified: Secondary | ICD-10-CM | POA: Diagnosis not present

## 2019-08-25 DIAGNOSIS — R519 Headache, unspecified: Secondary | ICD-10-CM

## 2019-08-25 LAB — CBC WITH DIFFERENTIAL/PLATELET
Basophils Absolute: 0 10*3/uL (ref 0.0–0.1)
Basophils Relative: 0.9 % (ref 0.0–3.0)
Eosinophils Absolute: 0 10*3/uL (ref 0.0–0.7)
Eosinophils Relative: 1.1 % (ref 0.0–5.0)
HCT: 38.2 % (ref 36.0–46.0)
Hemoglobin: 12.1 g/dL (ref 12.0–15.0)
Lymphocytes Relative: 45 % (ref 12.0–46.0)
Lymphs Abs: 2 10*3/uL (ref 0.7–4.0)
MCHC: 31.7 g/dL (ref 30.0–36.0)
MCV: 90.4 fl (ref 78.0–100.0)
Monocytes Absolute: 0.3 10*3/uL (ref 0.1–1.0)
Monocytes Relative: 7.8 % (ref 3.0–12.0)
Neutro Abs: 2 10*3/uL (ref 1.4–7.7)
Neutrophils Relative %: 45.2 % (ref 43.0–77.0)
Platelets: 264 10*3/uL (ref 150.0–400.0)
RBC: 4.23 Mil/uL (ref 3.87–5.11)
RDW: 13.7 % (ref 11.5–15.5)
WBC: 4.4 10*3/uL (ref 4.0–10.5)

## 2019-08-25 LAB — COMPREHENSIVE METABOLIC PANEL
ALT: 10 U/L (ref 0–35)
AST: 19 U/L (ref 0–37)
Albumin: 4.3 g/dL (ref 3.5–5.2)
Alkaline Phosphatase: 55 U/L (ref 39–117)
BUN: 11 mg/dL (ref 6–23)
CO2: 31 mEq/L (ref 19–32)
Calcium: 9.2 mg/dL (ref 8.4–10.5)
Chloride: 104 mEq/L (ref 96–112)
Creatinine, Ser: 0.81 mg/dL (ref 0.40–1.20)
GFR: 87.97 mL/min (ref >60.00)
Glucose, Bld: 82 mg/dL (ref 70–99)
Potassium: 3.8 mEq/L (ref 3.5–5.1)
Sodium: 140 mEq/L (ref 135–145)
Total Bilirubin: 0.4 mg/dL (ref 0.2–1.2)
Total Protein: 7.2 g/dL (ref 6.0–8.3)

## 2019-08-25 NOTE — Progress Notes (Signed)
Anne Duncan    [id number]    [birth year]  Primary Care Physician:Gomez, Maura Crandall, PA-C  Referring Physician: Loletta Specter, PA-C No address on file  Chief complaint: Follow-up for sarcoidosis  HPI: Anne Duncan is a 58 year old with diagnosis of sarcoid.  Last seen in pulmonary clinic in 2017 She was diagnosed with sarcoid in 1991 with a bronchoscope biopsy. She had been on prednisone for a year at a time and on and off for a few years since then. She is apparently in remission for many years and had stable respiratory symptoms.  She is here to reestablish care.  States that her breathing is doing well with no issues.  She does get short of breath when she does N95 mask for work.  No cough, sputum production, fevers,  Chief complaint today is sharp pain in the left side of the forehead and face that occurs occasionally.  She is taking over-the-counter pain medications for the  Pets: No pets Occupation: Works as a Engineer, site at a rehab facility Exposures: No known exposures.  No mold, hot tub, Jacuzzi or down pillows or comforter Smoking history: Minimal smoking history.  Quit in 1997 Travel history: Previously lived in [place], [place].  No significant recent travel Relevant family history: No significant family history of lung disease  Outpatient Encounter Medications as of 08/25/2019  Medication Sig  . [DISCONTINUED] Calcium Carbonate-Vit D-Min (CALTRATE 600+D PLUS MINERALS) 600-800 MG-UNIT TABS Take 2 tablets by mouth daily.  . [DISCONTINUED] celecoxib (CELEBREX) 200 MG capsule Take 1 capsule (200 mg total) by mouth 2 (two) times daily.  . [DISCONTINUED] naproxen (NAPROSYN) 500 MG tablet Take 1 tablet (500 mg total) by mouth 2 (two) times daily with a meal.   No facility-administered encounter medications on file as of 08/25/2019.    Allergies as of 08/25/2019 - Review Complete 08/25/2019  Allergen Reaction Noted  . Penicillins      Past Medical  History:  Diagnosis Date  . Dyspnea   . GERD (gastroesophageal reflux disease)   . Sarcoidosis     Past Surgical History:  Procedure Laterality Date  . LAPAROSCOPY    . removal bone spurs right shoulder    . tubal ligation and reversal      Family History  Problem Relation Age of Onset  . Asthma Sister     Social History   Socioeconomic History  . Marital status: Married    Spouse name: Renae Fickle  . Number of children: 0  . Years of education: 80  . Highest education level: Not on file  Occupational History    Comment: [employer]  Tobacco Use  . Smoking status: Former Smoker    Packs/day: 0.10    Years: 12.00    Pack years: 1.20    Types: Cigarettes    Quit date: 07/22/1995    Years since quitting: 24.1  . Smokeless tobacco: Never Used  Substance and Sexual Activity  . Alcohol use: No  . Drug use: No  . Sexual activity: Not on file  Other Topics Concern  . Not on file  Social History Narrative   Married, lives with husband   08/11/18 Works at [employer] as a Scientist, clinical (histocompatibility and immunogenetics)   No children   No recent travel   Social Determinants of Corporate investment banker Strain:   . Difficulty of Paying Living Expenses: Not on file  Food Insecurity:   .  Worried About Charity fundraiser in the Last Year: Not on file  . Ran Out of Food in the Last Year: Not on file  Transportation Needs:   . Lack of Transportation (Medical): Not on file  . Lack of Transportation (Non-Medical): Not on file  Physical Activity:   . Days of Exercise per Week: Not on file  . Minutes of Exercise per Session: Not on file  Stress:   . Feeling of Stress : Not on file  Social Connections:   . Frequency of Communication with Friends and Family: Not on file  . Frequency of Social Gatherings with Friends and Family: Not on file  . Attends Religious Services: Not on file  . Active Member of Clubs or Organizations: Not on file  . Attends Archivist Meetings: Not  on file  . Marital Status: Not on file  Intimate Partner Violence:   . Fear of Current or Ex-Partner: Not on file  . Emotionally Abused: Not on file  . Physically Abused: Not on file  . Sexually Abused: Not on file   Review of systems: Review of Systems  Constitutional: Negative for fever and chills.  HENT: Negative.   Eyes: Negative for blurred vision.  Respiratory: as per HPI  Cardiovascular: Negative for chest pain and palpitations.  Gastrointestinal: Negative for vomiting, diarrhea, blood per rectum. Genitourinary: Negative for dysuria, urgency, frequency and hematuria.  Musculoskeletal: Negative for myalgias, back pain and joint pain.  Skin: Negative for itching and rash.  Neurological: Negative for dizziness, tremors, focal weakness, seizures and loss of consciousness.  Endo/Heme/Allergies: Negative for environmental allergies.  Psychiatric/Behavioral: Negative for depression, suicidal ideas and hallucinations.  All other systems reviewed and are negative.  Physical Exam: Blood pressure 120/82, pulse 75, temperature 97.6 F (36.4 C), temperature source Temporal, height 5\' 6"  (1.676 m), weight 147 lb 3.2 oz (66.8 kg), SpO2 99 %. Gen:      No acute distress HEENT:  EOMI, sclera anicteric Neck:     No masses; no thyromegaly Lungs:    Clear to auscultation bilaterally; normal respiratory effort CV:         Regular rate and rhythm; no murmurs Abd:      + bowel sounds; soft, non-tender; no palpable masses, no distension Ext:    No edema; adequate peripheral perfusion Skin:      Warm and dry; no rash Neuro: alert and oriented x 3 Psych: normal mood and affect  Data Reviewed: Imaging: Chest x-ray 09/15/2012-Stable peribronchial thickening and interstitial prominence. Chest x-ray 10/10/2015-mild coarsening of the interstitium.  PFTs:  Labs: ACE level 10/10/2015-16 Comprehensive metabolic panel 68/11/2120-QMGNOI normal limits CBC 06/17/2018-WBC 5.1, eos 2%, absolute eosinophil  count 110  Assessment:  Sarcoidosis Has been in remission she has not used prednisone for the past 24 years We will get baseline assessment with chest x-ray, EKG, CMP and CBC  Left face pain May be migraine.  I will refer her to neurology for further evaluation  Plan/Recommendations: - CBC, CMP, EKG, chest x-ray - Neurology referral  Marshell Garfinkel MD [facility] 08/25/2019, 8:37 AM  CC: Clent Demark, PA-C

## 2019-08-25 NOTE — Addendum Note (Signed)
Addended by: Hildred Alamin I on: 08/25/2019 09:12 AM   Modules accepted: Orders

## 2019-08-25 NOTE — Addendum Note (Signed)
Addended by: Suzzanne Cloud E on: 08/25/2019 09:21 AM   Modules accepted: Orders

## 2019-08-25 NOTE — Patient Instructions (Addendum)
We will get a chest x-ray today Check labs including CBC with differential, comprehensive metabolic panel, EKG to reevaluate your sarcoid We will refer you to Dr. Leta Baptist at [facility] neurology for evaluation of left face pain.  I suspect this may be migraine  Follow-up in 3 months

## 2019-08-30 ENCOUNTER — Telehealth: Payer: Self-pay | Admitting: Internal Medicine

## 2019-08-30 NOTE — Telephone Encounter (Signed)
CXR is stable. There are no acute or new findings.   Spoke with pt, aware of results/recs.  Nothing further needed.

## 2019-10-23 ENCOUNTER — Other Ambulatory Visit: Payer: Self-pay

## 2019-10-23 ENCOUNTER — Ambulatory Visit (HOSPITAL_COMMUNITY)
Admission: EM | Admit: 2019-10-23 | Discharge: 2019-10-23 | Disposition: A | Discharge status: Home / Self Care | Payer: No Typology Code available for payment source | Attending: Urgent Care | Admitting: Urgent Care

## 2019-10-23 ENCOUNTER — Encounter (HOSPITAL_COMMUNITY): Payer: Self-pay | Admitting: Emergency Medicine

## 2019-10-23 DIAGNOSIS — H9202 Otalgia, left ear: Secondary | ICD-10-CM

## 2019-10-23 DIAGNOSIS — H6122 Impacted cerumen, left ear: Secondary | ICD-10-CM

## 2019-10-23 NOTE — ED Triage Notes (Signed)
Left ear popping, speech sounds like an echo, pain shooting through it, and has a humming noise.  Onset of symptoms one week ago.    Denies cold symptoms

## 2019-10-23 NOTE — ED Provider Notes (Signed)
  [facility]   MRN: [id number] DOB: December 08, 1960  Subjective:   Anne Duncan is a 59 y.o. female presenting for 1+ week hx cerumen impaction.  No current facility-administered medications for this encounter. No current outpatient medications on file.   Allergies  Allergen Reactions  . Penicillins     REACTION: intolerant    Past Medical History:  Diagnosis Date  . Dyspnea   . GERD (gastroesophageal reflux disease)   . Sarcoidosis      Past Surgical History:  Procedure Laterality Date  . LAPAROSCOPY    . removal bone spurs right shoulder    . tubal ligation and reversal      Family History  Problem Relation Age of Onset  . Asthma Sister     Social History   Tobacco Use  . Smoking status: Former Smoker    Packs/day: 0.10    Years: 12.00    Pack years: 1.20    Types: Cigarettes    Quit date: 07/22/1995    Years since quitting: 24.2  . Smokeless tobacco: Never Used  Substance Use Topics  . Alcohol use: No  . Drug use: No    ROS   Objective:   Vitals: BP 123/89 (BP Location: Left Arm)   Pulse 76   Temp 98.5 F (36.9 C) (Oral)   Resp 18   SpO2 97%   Physical Exam Constitutional:      General: She is not in acute distress.    Appearance: Normal appearance. She is well-developed. She is not ill-appearing.  HENT:     Head: Normocephalic and atraumatic.     Right Ear: Tympanic membrane normal.     Left Ear: Tympanic membrane normal. There is impacted cerumen.     Ears:     Comments: TM clear status post ear lavage.    Nose: Nose normal.     Mouth/Throat:     Mouth: Mucous membranes are moist.     Pharynx: Oropharynx is clear.  Eyes:     General: No scleral icterus.       Right eye: No discharge.        Left eye: No discharge.     Extraocular Movements: Extraocular movements intact.     Pupils: Pupils are equal, round, and reactive to light.  Cardiovascular:     Rate and Rhythm: Normal rate.  Pulmonary:     Effort: Pulmonary effort  is normal.  Skin:    General: Skin is warm and dry.  Neurological:     General: No focal deficit present.     Mental Status: She is alert and oriented to person, place, and time.  Psychiatric:        Mood and Affect: Mood normal.        Behavior: Behavior normal.        Thought Content: Thought content normal.        Judgment: Judgment normal.    Ear lavage performed using mixture of peroxide and water.  Pressure irrigation performed using a bottle and a thin ear tube.  Left ear lavage only.  No curette was used.  Procedure performed by RN Claris Gower.  Assessment and Plan :   1. Impacted cerumen of left ear   2. Left ear pain     Successful left ear lavage.  Anticipatory guidance provided. Counseled patient on potential for adverse effects with medications prescribed/recommended today, ER and return-to-clinic precautions discussed, patient verbalized understanding.   Wallis Bamberg, PA-C 10/23/19 1059

## 2020-05-21 ENCOUNTER — Ambulatory Visit (HOSPITAL_COMMUNITY)
Admission: EM | Admit: 2020-05-21 | Discharge: 2020-05-21 | Disposition: A | Discharge status: Home / Self Care | Payer: PRIVATE HEALTH INSURANCE

## 2020-05-21 ENCOUNTER — Ambulatory Visit (INDEPENDENT_AMBULATORY_CARE_PROVIDER_SITE_OTHER): Payer: PRIVATE HEALTH INSURANCE

## 2020-05-21 ENCOUNTER — Encounter (HOSPITAL_COMMUNITY): Payer: Self-pay

## 2020-05-21 ENCOUNTER — Other Ambulatory Visit: Payer: Self-pay

## 2020-05-21 DIAGNOSIS — R0602 Shortness of breath: Secondary | ICD-10-CM | POA: Diagnosis not present

## 2020-05-21 DIAGNOSIS — M545 Low back pain, unspecified: Secondary | ICD-10-CM

## 2020-05-21 DIAGNOSIS — D869 Sarcoidosis, unspecified: Secondary | ICD-10-CM | POA: Diagnosis not present

## 2020-05-21 DIAGNOSIS — R0781 Pleurodynia: Secondary | ICD-10-CM | POA: Diagnosis not present

## 2020-05-21 DIAGNOSIS — R071 Chest pain on breathing: Secondary | ICD-10-CM

## 2020-05-21 MED ORDER — DEXAMETHASONE SODIUM PHOSPHATE 10 MG/ML IJ SOLN
INTRAMUSCULAR | Status: AC
  Filled 2020-05-21: qty 1

## 2020-05-21 MED ORDER — PREDNISONE 20 MG PO TABS: 40.0000 mg | ORAL_TABLET | Freq: Every day | ORAL | 0 refills | Status: AC

## 2020-05-21 MED ORDER — KETOROLAC TROMETHAMINE 60 MG/2ML IM SOLN
INTRAMUSCULAR | Status: AC
  Filled 2020-05-21: qty 2

## 2020-05-21 MED ORDER — DEXAMETHASONE SODIUM PHOSPHATE 10 MG/ML IJ SOLN
10.0000 mg | Freq: Once | INTRAMUSCULAR | Status: AC
  Administered 2020-05-21: 10 mg via INTRAMUSCULAR

## 2020-05-21 MED ORDER — KETOROLAC TROMETHAMINE 60 MG/2ML IM SOLN
60.0000 mg | Freq: Once | INTRAMUSCULAR | Status: AC
  Administered 2020-05-21: 60 mg via INTRAMUSCULAR

## 2020-05-21 NOTE — Discharge Instructions (Addendum)
I have sent prednisone 40 mg daily for 5 days. Start tomorrow if symptoms of pain doesn't resolve with treatment.

## 2020-05-21 NOTE — ED Provider Notes (Signed)
[facility]    CSN: [id number] Arrival date & time: 05/21/20  1144      History   Chief Complaint Chief Complaint  Patient presents with  . Shortness of Breath   HPI Anne Duncan is a 59 y.o. female.   HPI Presents for evaluation of right chest wall, rib and back pain exacerbated by breathing. Onset of symptoms around 130 AM this morning. Pain has gradually increased throughout the day. She has an episode of shortness of breath as she walked to UC from her placed of employment. History of sarcoidosis, last seen by pulmonology in 2020. No history of PE or pneumonia. 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 Past Medical History:  Diagnosis Date  . Dyspnea   . GERD (gastroesophageal reflux disease)   . Sarcoidosis     Patient Active Problem List   Diagnosis Date Noted  . Costochondritis 08/13/2012  . GASTRITIS 09/21/2008  . GERD 07/19/2008  . SARCOIDOSIS 07/18/2008  . DYSPNEA 07/18/2008    Past Surgical History:  Procedure Laterality Date  . LAPAROSCOPY    . removal bone spurs right shoulder    . tubal ligation and reversal      OB History   No obstetric history on file.      Home Medications    Prior to Admission medications   Medication Sig Start Date End Date Taking? Authorizing Provider  ibuprofen (ADVIL) 400 MG tablet SMARTSIG:1-2 Tablet(s) By Mouth 3-4 Times Daily PRN 12/26/19   [provider]    Family History Family History  Problem Relation Age of Onset  . Asthma Sister     Social History Social History   Tobacco Use  . Smoking status: Former Smoker    Packs/day: 0.10    Years: 12.00    Pack years: 1.20    Types: Cigarettes    Quit date: 07/22/1995    Years since quitting: 24.8  . Smokeless tobacco: Never Used  Substance Use Topics  . Alcohol use: No  . Drug use: No     Allergies   Penicillins   Review of Systems Review of Systems Pertinent  negatives listed in HPI Physical Exam Triage Vital Signs ED Triage Vitals  Enc Vitals Group     BP 05/21/20 1158 122/72     Pulse Rate 05/21/20 1158 87     Resp 05/21/20 1158 (!) 28     Temp 05/21/20 1158 98.4 F (36.9 C)     Temp Source 05/21/20 1158 Oral     SpO2 05/21/20 1158 99 %     Weight --      Height --      Head Circumference --      Peak Flow --      Pain Score 05/21/20 1200 6     Pain Loc --      Pain Edu? --      Excl. in GC? --    No data found.  Updated Vital Signs BP 122/72 (BP Location: Right Arm)   Pulse 87   Temp 98.4 F (36.9 C) (Oral)   Resp (!) 28   SpO2 99%   Visual Acuity Right Eye Distance:   Left Eye Distance:   Bilateral Distance:    Right Eye Near:   Left Eye Near:    Bilateral Near:     Physical Exam General appearance: alert, well developed, well nourished, cooperative and in no distress Head:  Normocephalic, without obvious abnormality, atraumatic Respiratory: Effort breathing normal, CTABL Heart: rate and rhythm normal. No gallop or murmurs noted on exam  Abdomen: BS +, no distention, no rebound tenderness, or no mass Extremities: No gross deformities Skin: Skin color, texture, turgor normal. No rashes seen  Psych: Appropriate mood and affect. Neurologic: Mental status: Alert, oriented to person, place, and time, thought content appropriate.  UC Treatments / Results  Labs (all labs ordered are listed, but only abnormal results are displayed) Labs Reviewed - No data to display  EKG   Radiology No results found.  Procedures Procedures (including critical care time)  Medications Ordered in UC Medications - No data to display  Initial Impression / Assessment and Plan / UC Course  I have reviewed the triage vital signs and the nursing notes.  Pertinent labs & imaging results that were available during my care of the patient were reviewed by me and considered in my medical decision making (see chart for details).       Respirations improve 18 breaths per minute with rest. No chest pain.  Lung exam reassuring. Chest x-ray, consistent with chronic changes. No acute.  See discharge medication orders. Final Clinical Impressions(s) / UC Diagnoses   Final diagnoses:  Rib pain on right side  Right low back pain, unspecified chronicity, unspecified whether sciatica present  Pain aggravated by breathing     Discharge Instructions     I have sent prednisone 40 mg daily for 5 days. Start tomorrow if symptoms of pain doesn't resolve with treatment.    ED Prescriptions    Medication Sig Dispense Auth. Provider   predniSONE (DELTASONE) 20 MG tablet Take 2 tablets (40 mg total) by mouth daily with breakfast for 5 days. 10 tablet Bing Neighbors, FNP     PDMP not reviewed this encounter.   Bing Neighbors, FNP 05/27/20 1520

## 2020-05-21 NOTE — ED Triage Notes (Addendum)
Pt c/o SOB, right rib/back pain that pt states 'feels like a pulled muscle"-acute onset last night. States pain increases with movement, inspiration and on palpation.   Denies recent injury/trauma or exertion, CP, fever, chills, congestion or other URI sx, body aches, loss of taste/smell, swelling or pain to BLE, n/v/d, abdominal pain.  Reports h/o sarcoidosis. Took tylenol 650mg  at approx 0900 today. Gets tested twice/week for COVID-pt reports negative rapid test last Friday.  Lungs CTA bilaterally.

## 2020-11-15 ENCOUNTER — Ambulatory Visit: Payer: No Typology Code available for payment source | Admitting: Nurse Practitioner

## 2020-11-21 ENCOUNTER — Other Ambulatory Visit (HOSPITAL_COMMUNITY)
Admission: RE | Admit: 2020-11-21 | Discharge: 2020-11-21 | Disposition: A | Discharge status: Home / Self Care | Payer: PRIVATE HEALTH INSURANCE | Source: Ambulatory Visit | Attending: Nurse Practitioner | Admitting: Nurse Practitioner

## 2020-11-21 ENCOUNTER — Ambulatory Visit: Payer: PRIVATE HEALTH INSURANCE | Admitting: Nurse Practitioner

## 2020-11-21 ENCOUNTER — Other Ambulatory Visit: Payer: Self-pay

## 2020-11-21 ENCOUNTER — Encounter: Payer: Self-pay | Admitting: Nurse Practitioner

## 2020-11-21 VITALS — BP 136/80 | HR 84 | Temp 98.3°F | Ht 65.4 in | Wt 147.6 lb

## 2020-11-21 DIAGNOSIS — Z124 Encounter for screening for malignant neoplasm of cervix: Secondary | ICD-10-CM

## 2020-11-21 DIAGNOSIS — Z7689 Persons encountering health services in other specified circumstances: Secondary | ICD-10-CM

## 2020-11-21 DIAGNOSIS — Z0000 Encounter for general adult medical examination without abnormal findings: Secondary | ICD-10-CM | POA: Diagnosis not present

## 2020-11-21 DIAGNOSIS — E559 Vitamin D deficiency, unspecified: Secondary | ICD-10-CM

## 2020-11-21 DIAGNOSIS — Z13228 Encounter for screening for other metabolic disorders: Secondary | ICD-10-CM

## 2020-11-21 DIAGNOSIS — Z1231 Encounter for screening mammogram for malignant neoplasm of breast: Secondary | ICD-10-CM | POA: Diagnosis not present

## 2020-11-21 NOTE — Patient Instructions (Signed)
Healthy Eating Following a healthy eating pattern may help you to achieve and maintain a healthy body weight, reduce the risk of chronic disease, and live a long and productive life. It is important to follow a healthy eating pattern at an appropriate calorie level for your body. Your nutritional needs should be met primarily through food by choosing a variety of nutrient-rich foods. What are tips for following this plan? Reading food labels  Read labels and choose the following: ? Reduced or low sodium. ? Juices with 100% fruit juice. ? Foods with low saturated fats and high polyunsaturated and monounsaturated fats. ? Foods with whole grains, such as whole wheat, cracked wheat, brown rice, and wild rice. ? Whole grains that are fortified with folic acid. This is recommended for women who are pregnant or who want to become pregnant.  Read labels and avoid the following: ? Foods with a lot of added sugars. These include foods that contain brown sugar, corn sweetener, corn syrup, dextrose, fructose, glucose, high-fructose corn syrup, honey, invert sugar, lactose, malt syrup, maltose, molasses, raw sugar, sucrose, trehalose, or turbinado sugar.  Do not eat more than the following amounts of added sugar per day:  6 teaspoons (25 g) for women.  9 teaspoons (38 g) for men. ? Foods that contain processed or refined starches and grains. ? Refined grain products, such as white flour, degermed cornmeal, white bread, and white rice. Shopping  Choose nutrient-rich snacks, such as vegetables, whole fruits, and nuts. Avoid high-calorie and high-sugar snacks, such as potato chips, fruit snacks, and candy.  Use oil-based dressings and spreads on foods instead of solid fats such as butter, stick margarine, or cream cheese.  Limit pre-made sauces, mixes, and "instant" products such as flavored rice, instant noodles, and ready-made pasta.  Try more plant-protein sources, such as tofu, tempeh, black beans,  edamame, lentils, nuts, and seeds.  Explore eating plans such as the Mediterranean diet or vegetarian diet. Cooking  Use oil to saut or stir-fry foods instead of solid fats such as butter, stick margarine, or lard.  Try baking, boiling, grilling, or broiling instead of frying.  Remove the fatty part of meats before cooking.  Steam vegetables in water or broth. Meal planning  At meals, imagine dividing your plate into fourths: ? One-half of your plate is fruits and vegetables. ? One-fourth of your plate is whole grains. ? One-fourth of your plate is protein, especially lean meats, poultry, eggs, tofu, beans, or nuts.  Include low-fat dairy as part of your daily diet.   Lifestyle  Choose healthy options in all settings, including home, work, school, restaurants, or stores.  Prepare your food safely: ? Wash your hands after handling raw meats. ? Keep food preparation surfaces clean by regularly washing with hot, soapy water. ? Keep raw meats separate from ready-to-eat foods, such as fruits and vegetables. ? Cook seafood, meat, poultry, and eggs to the recommended internal temperature. ? Store foods at safe temperatures. In general:  Keep cold foods at 7F (4.4C) or below.  Keep hot foods at 17F (60C) or above.  Keep your freezer at [facility] (-17.8C) or below.  Foods are no longer safe to eat when they have been between the temperatures of 40-17F (4.4-60C) for more than 2 hours. What foods should I eat? Fruits Aim to eat 2 cup-equivalents of fresh, canned (in natural juice), or frozen fruits each day. Examples of 1 cup-equivalent of fruit include 1 small apple, 8 large strawberries, 1 cup canned fruit,  cup dried fruit, or 1 cup 100% juice. Vegetables Aim to eat 2-3 cup-equivalents of fresh and frozen vegetables each day, including different varieties and colors. Examples of 1 cup-equivalent of vegetables include 2 medium carrots, 2 cups raw, leafy greens, 1 cup chopped  vegetable (raw or cooked), or 1 medium baked potato. Grains Aim to eat 6 ounce-equivalents of whole grains each day. Examples of 1 ounce-equivalent of grains include 1 slice of bread, 1 cup ready-to-eat cereal, 3 cups popcorn, or  cup cooked rice, pasta, or cereal. Meats and other proteins Aim to eat 5-6 ounce-equivalents of protein each day. Examples of 1 ounce-equivalent of protein include 1 egg, 1/2 cup nuts or seeds, or 1 tablespoon (16 g) peanut butter. A cut of meat or fish that is the size of a deck of cards is about 3-4 ounce-equivalents.  Of the protein you eat each week, try to have at least 8 ounces come from seafood. This includes salmon, trout, herring, and anchovies. Dairy Aim to eat 3 cup-equivalents of fat-free or low-fat dairy each day. Examples of 1 cup-equivalent of dairy include 1 cup (240 mL) milk, 8 ounces (250 g) yogurt, 1 ounces (44 g) natural cheese, or 1 cup (240 mL) fortified soy milk. Fats and oils  Aim for about 5 teaspoons (21 g) per day. Choose monounsaturated fats, such as canola and olive oils, avocados, peanut butter, and most nuts, or polyunsaturated fats, such as sunflower, corn, and soybean oils, walnuts, pine nuts, sesame seeds, sunflower seeds, and flaxseed. Beverages  Aim for six 8-oz glasses of water per day. Limit coffee to three to five 8-oz cups per day.  Limit caffeinated beverages that have added calories, such as soda and energy drinks.  Limit alcohol intake to no more than 1 drink a day for nonpregnant women and 2 drinks a day for men. One drink equals 12 oz of beer (355 mL), 5 oz of wine (148 mL), or 1 oz of hard liquor (44 mL). Seasoning and other foods  Avoid adding excess amounts of salt to your foods. Try flavoring foods with herbs and spices instead of salt.  Avoid adding sugar to foods.  Try using oil-based dressings, sauces, and spreads instead of solid fats. This information is based on general [place] nutrition guidelines. For more  information, visit choosemyplate.gov. Exact amounts may vary based on your nutrition needs. Summary  A healthy eating plan may help you to maintain a healthy weight, reduce the risk of chronic diseases, and stay active throughout your life.  Plan your meals. Make sure you eat the right portions of a variety of nutrient-rich foods.  Try baking, boiling, grilling, or broiling instead of frying.  Choose healthy options in all settings, including home, work, school, restaurants, or stores. This information is not intended to replace advice given to you by your health care provider. Make sure you discuss any questions you have with your health care provider. Document Revised: 12/07/2017 Document Reviewed: 12/07/2017 Elsevier Patient Education  2021 [employer].  

## 2020-11-21 NOTE — Progress Notes (Signed)
I,Tianna Badgett,acting as a Education administrator for [employer], NP.,have documented all relevant documentation on the behalf of [employer], NP,as directed by  Bary Castilla, NP while in the presence of Bary Castilla, NP.  This visit occurred during the SARS-CoV-2 public health emergency.  Safety protocols were in place, including screening questions prior to the visit, additional usage of staff PPE, and extensive cleaning of exam room while observing appropriate contact time as indicated for disinfecting solutions.  Subjective:     Patient ID: Anne Duncan , female    DOB: 1961/05/01 , 60 y.o.   MRN: [id number]   Chief Complaint  Patient presents with  . Establish Care    HPI  Patient is here to establish care. The last time she saw a PCP 2019. She has been seeing a pulmonologist Dr. Annamaria Boots for sarcoidosis. Mother was diagnosed with cancer recently. She does not take any medication. The last time she had a paps smear was in 2019. She started post menopausal at the age of 47. She had a colonoscopy at the age of 49. She take multivitamin on a daily basis. She would also like to get a mammogram. No other concerns. Diet: A lot of vegetables, fruits, chicken and fish. She does not eat a lot beef or pork. She does eat healthy.  Exercise: she tries to do walking.    Past Medical History:  Diagnosis Date  . Dyspnea   . GERD (gastroesophageal reflux disease)   . Sarcoidosis      Family History  Problem Relation Age of Onset  . Asthma Sister   . Cancer Mother   . Hypertension Brother   . Hypertension Sister     No current outpatient medications on file.   Allergies  Allergen Reactions  . Penicillins     REACTION: intolerant     Review of Systems  Constitutional: Negative.  Negative for chills and fatigue.  HENT: Negative.  Negative for congestion, sinus pressure, sinus pain, sneezing and sore throat.   Eyes: Negative for pain and redness.  Respiratory: Negative.   Negative for chest tightness, shortness of breath and wheezing.   Cardiovascular: Negative.  Negative for chest pain and palpitations.  Gastrointestinal: Negative.  Negative for constipation, diarrhea and nausea.  Endocrine: Negative.  Negative for polydipsia, polyphagia and polyuria.  Genitourinary: Negative.  Negative for flank pain and frequency.  Musculoskeletal: Negative for arthralgias, back pain, gait problem and myalgias.  Skin: Negative.   Allergic/Immunologic: Negative.   Neurological: Negative for headaches.  Hematological: Negative.   Psychiatric/Behavioral: Negative.      Today's Vitals   11/21/20 1445  BP: 136/80  Pulse: 84  Temp: 98.3 F (36.8 C)  TempSrc: Oral  Weight: 147 lb 9.6 oz (67 kg)  Height: 5' 5.4" (1.661 m)   Body mass index is 24.26 kg/m.   Objective:  Physical Exam Vitals and nursing note reviewed. Exam conducted with a chaperone present.  Constitutional:      Appearance: Normal appearance.  HENT:     Head: Normocephalic and atraumatic.     Right Ear: Tympanic membrane, ear canal and external ear normal.     Left Ear: Tympanic membrane, ear canal and external ear normal.     Nose:     Comments: Deferred. Masked     Mouth/Throat:     Comments: Deferred. Masked  Eyes:     Extraocular Movements: Extraocular movements intact.     Conjunctiva/sclera: Conjunctivae normal.     Pupils: Pupils are equal, round,  and reactive to light.  Cardiovascular:     Rate and Rhythm: Normal rate and regular rhythm.     Pulses: Normal pulses.     Heart sounds: Normal heart sounds. No murmur heard.   Pulmonary:     Effort: Pulmonary effort is normal. No respiratory distress.     Breath sounds: Normal breath sounds. No wheezing.  Chest:  Breasts:     Tanner Score is 5.     Right: No swelling or nipple discharge.     Left: Normal. No swelling or nipple discharge.    Abdominal:     General: Bowel sounds are normal.     Palpations: Abdomen is soft.      Tenderness: There is no abdominal tenderness.  Genitourinary:    General: Normal vulva.     Labia:        Right: No rash.        Left: No rash.      Urethra: No urethral pain.     Vagina: Normal. No vaginal discharge.     Rectum: Normal.  Musculoskeletal:        General: No swelling or tenderness. Normal range of motion.     Cervical back: Normal range of motion and neck supple.  Skin:    General: Skin is warm and dry.     Capillary Refill: Capillary refill takes less than 2 seconds.  Neurological:     General: No focal deficit present.     Mental Status: She is alert and oriented to person, place, and time.     Motor: No weakness.     Gait: Gait normal.  Psychiatric:        Mood and Affect: Mood normal.        Behavior: Behavior normal.         Assessment And Plan:     1. Encounter to establish care Patient here to establish care. Discussed with patient immunization, screening and multivitamins. Discussed with patient medical, surgical, family history. Discussed with patient medications and allergies.   2. Encounter for annual physical exam - Lipid panel - CBC no Diff - CMP14+EGFR  3. Vitamin D deficiency -Will check and assess for supplementation  - Vitamin D (25 hydroxy)  4. Encounter for screening mammogram for breast cancer - MM Digital Screening; Future  5. Encounter for Papanicolaou smear for cervical cancer screening -Will send out - Cytology -Pap Smear  6. Encounter for screening for metabolic disorder -Will assess for any metabolic disorder.  - Hemoglobin A1c   Staying healthy and adopting a healthy lifestyle for your overall health is important. You should eat 7 or more servings of fruits and vegetables per day. You should drink plenty of water to keep yourself hydrated and your kidneys healthy. This includes about 65-80+ fluid ounces of water. Limit your intake of animal fats especially for elevated cholesterol. Avoid highly processed food and limit  your salt intake if you have hypertension. Avoid foods high in saturated/Trans fats. Along with a healthy diet it is also very important to maintain time for yourself to maintain a healthy mental health with low stress levels. You should get atleast 150 min of moderate intensity exercise weekly for a healthy heart. Along with eating right and exercising, aim for at least 7-9 hours of sleep daily.  Eat more whole grains which includes barley, wheat berries, oats, brown rice and whole wheat pasta. Use healthy plant oils which include olive, soy, corn, sunflower and peanut. Limit your caffeine  and sugary drinks. Limit your intake of fast foods. Limit milk and dairy products to one or two daily servings.   Patient was given opportunity to ask questions. Patient verbalized understanding of the plan and was able to repeat key elements of the plan. All questions were answered to their satisfaction.  Bary Castilla, NP   I, Bary Castilla, NP, have reviewed all documentation for this visit. The documentation on 11/21/20 for the exam, diagnosis, procedures, and orders are all accurate and complete.   IF YOU HAVE BEEN REFERRED TO A SPECIALIST, IT MAY TAKE 1-2 WEEKS TO SCHEDULE/PROCESS THE REFERRAL. IF YOU HAVE NOT HEARD FROM US/SPECIALIST IN TWO WEEKS, PLEASE GIVE [place] A CALL AT [phone].   THE PATIENT IS ENCOURAGED TO PRACTICE SOCIAL DISTANCING DUE TO THE COVID-19 PANDEMIC.

## 2020-11-22 ENCOUNTER — Other Ambulatory Visit: Payer: Self-pay | Admitting: Nurse Practitioner

## 2020-11-22 DIAGNOSIS — E559 Vitamin D deficiency, unspecified: Secondary | ICD-10-CM

## 2020-11-22 LAB — CMP14+EGFR
ALT: 13 IU/L (ref 0–32)
AST: 19 IU/L (ref 0–40)
Albumin/Globulin Ratio: 1.9 (ref 1.2–2.2)
Albumin: 4.7 g/dL (ref 3.8–4.9)
Alkaline Phosphatase: 70 IU/L (ref 44–121)
BUN/Creatinine Ratio: 16 (ref 9–23)
BUN: 14 mg/dL (ref 6–24)
Bilirubin Total: 0.3 mg/dL (ref 0.0–1.2)
CO2: 22 mmol/L (ref 20–29)
Calcium: 9.4 mg/dL (ref 8.7–10.2)
Chloride: 104 mmol/L (ref 96–106)
Creatinine, Ser: 0.86 mg/dL (ref 0.57–1.00)
Globulin, Total: 2.5 g/dL (ref 1.5–4.5)
Glucose: 80 mg/dL (ref 65–99)
Potassium: 3.9 mmol/L (ref 3.5–5.2)
Sodium: 141 mmol/L (ref 134–144)
Total Protein: 7.2 g/dL (ref 6.0–8.5)
eGFR: 78 mL/min/{1.73_m2} (ref >59)

## 2020-11-22 LAB — CBC
Hematocrit: 35.6 % (ref 34.0–46.6)
Hemoglobin: 11.5 g/dL (ref 11.1–15.9)
MCH: 28.7 pg (ref 26.6–33.0)
MCHC: 32.3 g/dL (ref 31.5–35.7)
MCV: 89 fL (ref 79–97)
Platelets: 285 10*3/uL (ref 150–450)
RBC: 4.01 x10E6/uL (ref 3.77–5.28)
RDW: 12.8 % (ref 11.7–15.4)
WBC: 6.9 10*3/uL (ref 3.4–10.8)

## 2020-11-22 LAB — LIPID PANEL
Chol/HDL Ratio: 2.3 ratio (ref 0.0–4.4)
Cholesterol, Total: 191 mg/dL (ref 100–199)
HDL: 82 mg/dL (ref >39)
LDL Chol Calc (NIH): 97 mg/dL (ref 0–99)
Triglycerides: 62 mg/dL (ref 0–149)
VLDL Cholesterol Cal: 12 mg/dL (ref 5–40)

## 2020-11-22 LAB — HEMOGLOBIN A1C
Est. average glucose Bld gHb Est-mCnc: 111 mg/dL
Hgb A1c MFr Bld: 5.5 % (ref 4.8–5.6)

## 2020-11-22 LAB — VITAMIN D 25 HYDROXY (VIT D DEFICIENCY, FRACTURES): Vit D, 25-Hydroxy: 14.3 ng/mL — ABNORMAL LOW (ref 30.0–100.0)

## 2020-11-22 MED ORDER — VITAMIN D (ERGOCALCIFEROL) 1.25 MG (50000 UNIT) PO CAPS: 50000.0000 [IU] | ORAL_CAPSULE | ORAL | 0 refills | Status: DC

## 2020-11-26 LAB — CYTOLOGY - PAP
Chlamydia: NEGATIVE
Comment: NEGATIVE
Comment: NEGATIVE
Comment: NEGATIVE
Comment: NORMAL
Diagnosis: NEGATIVE
HSV1: NEGATIVE
HSV2: NEGATIVE
Neisseria Gonorrhea: NEGATIVE
Trichomonas: NEGATIVE

## 2020-11-28 LAB — HM MAMMOGRAPHY: HM Mammogram: NORMAL (ref 0–4)

## 2021-01-16 ENCOUNTER — Inpatient Hospital Stay: Admission: RE | Admit: 2021-01-16 | Payer: No Typology Code available for payment source | Source: Ambulatory Visit

## 2021-01-20 ENCOUNTER — Encounter (HOSPITAL_COMMUNITY): Payer: Self-pay | Admitting: *Deleted

## 2021-01-20 ENCOUNTER — Ambulatory Visit (HOSPITAL_COMMUNITY)
Admission: EM | Admit: 2021-01-20 | Discharge: 2021-01-20 | Disposition: A | Discharge status: Home / Self Care | Payer: No Typology Code available for payment source | Attending: Medical Oncology | Admitting: Medical Oncology

## 2021-01-20 DIAGNOSIS — M5441 Lumbago with sciatica, right side: Secondary | ICD-10-CM | POA: Diagnosis not present

## 2021-01-20 DIAGNOSIS — S46811A Strain of other muscles, fascia and tendons at shoulder and upper arm level, right arm, initial encounter: Secondary | ICD-10-CM

## 2021-01-20 MED ORDER — NAPROXEN 500 MG PO TABS: 500.0000 mg | ORAL_TABLET | Freq: Two times a day (BID) | ORAL | 0 refills | Status: DC

## 2021-01-20 MED ORDER — METHOCARBAMOL 500 MG PO TABS: 500.0000 mg | ORAL_TABLET | Freq: Two times a day (BID) | ORAL | 0 refills | Status: DC
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
## 2021-01-23 ENCOUNTER — Other Ambulatory Visit: Payer: Self-pay

## 2021-01-23 ENCOUNTER — Encounter: Payer: Self-pay | Admitting: Nurse Practitioner

## 2021-01-23 ENCOUNTER — Ambulatory Visit (INDEPENDENT_AMBULATORY_CARE_PROVIDER_SITE_OTHER): Payer: PRIVATE HEALTH INSURANCE | Admitting: Nurse Practitioner

## 2021-01-23 VITALS — BP 128/70 | HR 77 | Temp 98.0°F | Ht 65.4 in | Wt 147.0 lb

## 2021-01-23 DIAGNOSIS — E559 Vitamin D deficiency, unspecified: Secondary | ICD-10-CM | POA: Diagnosis not present

## 2021-01-23 MED ORDER — VITAMIN D (ERGOCALCIFEROL) 1.25 MG (50000 UNIT) PO CAPS: ORAL_CAPSULE | ORAL | 1 refills | Status: DC

## 2021-01-23 NOTE — Progress Notes (Signed)
I,Tianna Badgett,acting as a Neurosurgeon for [employer], NP.,have documented all relevant documentation on the behalf of [employer], NP,as directed by  Charlesetta Ivory, NP while in the presence of Charlesetta Ivory, NP.  This visit occurred during the SARS-CoV-2 public health emergency.  Safety protocols were in place, including screening questions prior to the visit, additional usage of staff PPE, and extensive cleaning of exam room while observing appropriate contact time as indicated for disinfecting solutions.  Subjective:     Patient ID: Anne Duncan , female    DOB: 17-Jul-1961 , 60 y.o.   MRN: [id number]   No chief complaint on file.   HPI  Patient is here for Vit D follow up. Otherwise she has no other concerns. We will wait to get another vitamin D check in another 3 months. Since it was taken in March. She was very low in Vitamin D at 14. She has been taking once weekly and has no refills remaining.       Past Medical History:  Diagnosis Date  . Dyspnea   . GERD (gastroesophageal reflux disease)   . Sarcoidosis      Family History  Problem Relation Age of Onset  . Asthma Sister   . Cancer Mother   . Hypertension Brother   . Hypertension Sister      Current Outpatient Medications:  .  methocarbamol (ROBAXIN) 500 MG tablet, Take 1 tablet (500 mg total) by mouth 2 (two) times daily., Disp: 20 tablet, Rfl: 0 .  naproxen (NAPROSYN) 500 MG tablet, Take 1 tablet (500 mg total) by mouth 2 (two) times daily., Disp: 30 tablet, Rfl: 0 .  Vitamin D, Ergocalciferol, (DRISDOL) 1.25 MG (50000 UNIT) CAPS capsule, Take 1 capsule by mouth on Tuesday and one capsule on Friday., Disp: 24 capsule, Rfl: 1   Allergies  Allergen Reactions  . Penicillins     REACTION: intolerant     Review of Systems  Constitutional: Negative for chills and fatigue.  Respiratory: Negative.  Negative for shortness of breath and wheezing.   Cardiovascular: Negative.  Negative for chest pain  and palpitations.  Gastrointestinal: Negative.  Negative for constipation and nausea.  Musculoskeletal: Negative for arthralgias and myalgias.  Neurological: Negative.  Negative for dizziness.     Today's Vitals   01/23/21 1423  BP: 128/70  Pulse: 77  Temp: 98 F (36.7 C)  TempSrc: Oral  Weight: 147 lb (66.7 kg)  Height: 5' 5.4" (1.661 m)   Body mass index is 24.16 kg/m.  Wt Readings from Last 3 Encounters:  01/23/21 147 lb (66.7 kg)  11/21/20 147 lb 9.6 oz (67 kg)  08/25/19 147 lb 3.2 oz (66.8 kg)    Objective:  Physical Exam Constitutional:      Appearance: Normal appearance.  HENT:     Head: Normocephalic and atraumatic.  Cardiovascular:     Rate and Rhythm: Normal rate and regular rhythm.     Pulses: Normal pulses.     Heart sounds: No murmur heard.   Pulmonary:     Effort: Pulmonary effort is normal. No respiratory distress.     Breath sounds: Normal breath sounds. No wheezing.  Skin:    Capillary Refill: Capillary refill takes less than 2 seconds.  Neurological:     General: No focal deficit present.     Mental Status: She is alert and oriented to person, place, and time.  Psychiatric:        Mood and Affect: Mood normal.  Behavior: Behavior normal.         Assessment And Plan:     1. Vitamin D deficiency -Last vitamin D on 11/18/20 was 14.3. Will recheck in 6 months from that date.  - Advised patient to spend atleast 15 min. Daily in sunlight.  -Will refill prescription due to previous levels being low at 14.3.  -Patient given instruction to take twice weekly.  - Vitamin D, Ergocalciferol, (DRISDOL) 1.25 MG (50000 UNIT) CAPS capsule; Take 1 capsule by mouth on Tuesday and one capsule on Friday.  Dispense: 24 capsule; Refill: 1   Follow up: 3 months for vitamin D check   Side effects and appropriate use of all the medication(s) were discussed with the patient today. Patient advised to use the medication(s) as directed by their healthcare  provider. The patient was encouraged to read, review, and understand all associated package inserts and contact our office with any questions or concerns. The patient accepts the risks of the treatment plan and had an opportunity to ask questions.   The patient was encouraged to call or send a message through MyChart for any questions or concerns.   Patient was given opportunity to ask questions. Patient verbalized understanding of the plan and was able to repeat key elements of the plan. All questions were answered to their satisfaction.  Raman Avonell Lenig, DNP   I, Raman Eann Cleland have reviewed all documentation for this visit. The documentation on 01/23/21 for the exam, diagnosis, procedures, and orders are all accurate and complete.    IF YOU HAVE BEEN REFERRED TO A SPECIALIST, IT MAY TAKE 1-2 WEEKS TO SCHEDULE/PROCESS THE REFERRAL. IF YOU HAVE NOT HEARD FROM US/SPECIALIST IN TWO WEEKS, PLEASE GIVE [place] A CALL AT [phone].   THE PATIENT IS ENCOURAGED TO PRACTICE SOCIAL DISTANCING DUE TO THE COVID-19 PANDEMIC.

## 2021-01-23 NOTE — Patient Instructions (Signed)
Vitamin D Deficiency Vitamin D deficiency is when your body does not have enough vitamin D. Vitamin D is important to your body because:  It helps your body use other minerals.  It helps to keep your bones strong and healthy.  It may help to prevent some diseases.  It helps your heart and other muscles work well. Not getting enough vitamin D can make your bones soft. It can also cause other health problems. What are the causes? This condition may be caused by:  Not eating enough foods that contain vitamin D.  Not getting enough sun.  Having diseases that make it hard for your body to absorb vitamin D.  Having a surgery in which a part of the stomach or a part of the small intestine is removed.  Having kidney disease or liver disease. What increases the risk? You are more likely to get this condition if:  You are older.  You do not spend much time outdoors.  You live in a nursing home.  You have had broken bones.  You have weak or thin bones (osteoporosis).  You have a disease or condition that changes how your body absorbs vitamin D.  You have dark skin.  You take certain medicines.  You are overweight or obese. What are the signs or symptoms?  In mild cases, there may not be any symptoms. If the condition is very bad, symptoms may include: ? Bone pain. ? Muscle pain. ? Falling often. ? Broken bones caused by a minor injury. How is this treated? Treatment may include taking supplements as told by your doctor. Your doctor will tell you what dose is best for you. Supplements may include:  Vitamin D.  Calcium. Follow these instructions at home: Eating and drinking  Eat foods that contain vitamin D, such as: ? Dairy products, cereals, or juices with added vitamin D. Check the label. ? Fish, such as salmon or trout. ? Eggs. ? Oysters. ? Mushrooms. The items listed above may not be a complete list of what you can eat and drink. Contact a dietitian for more  options.   General instructions  Take medicines and supplements only as told by your doctor.  Get regular, safe exposure to natural sunlight.  Do not use a tanning bed.  Maintain a healthy weight. Lose weight if needed.  Keep all follow-up visits as told by your doctor. This is important. How is this prevented?  You can get vitamin D by: ? Eating foods that naturally contain vitamin D. ? Eating or drinking products that have vitamin D added to them, such as cereals, juices, and milk. ? Taking vitamin D or a multivitamin that contains vitamin D. ? Being in the sun. Your body makes vitamin D when your skin is exposed to sunlight. Your body changes the sunlight into a form of the vitamin that it can use. Contact a doctor if:  Your symptoms do not go away.  You feel sick to your stomach (nauseous).  You throw up (vomit).  You poop less often than normal, or you have trouble pooping (constipation). Summary  Vitamin D deficiency is when your body does not have enough vitamin D.  Vitamin D helps to keep your bones strong and healthy.  This condition is often treated by taking a supplement.  Your doctor will tell you what dose is best for you. This information is not intended to replace advice given to you by your health care provider. Make sure you discuss any questions   you have with your health care provider. Document Revised: 05/03/2018 Document Reviewed: 05/03/2018 [employer]vier Patient Education  2021 [employer]  

## 2021-05-28 ENCOUNTER — Other Ambulatory Visit: Payer: Self-pay

## 2021-05-28 ENCOUNTER — Encounter: Payer: Self-pay | Admitting: Nurse Practitioner

## 2021-05-28 ENCOUNTER — Ambulatory Visit (INDEPENDENT_AMBULATORY_CARE_PROVIDER_SITE_OTHER): Payer: PRIVATE HEALTH INSURANCE | Admitting: Nurse Practitioner

## 2021-05-28 VITALS — BP 114/72 | HR 78 | Temp 98.6°F | Ht 65.4 in | Wt 151.2 lb

## 2021-05-28 DIAGNOSIS — M25511 Pain in right shoulder: Secondary | ICD-10-CM | POA: Diagnosis not present

## 2021-05-28 DIAGNOSIS — Z23 Encounter for immunization: Secondary | ICD-10-CM | POA: Diagnosis not present

## 2021-05-28 DIAGNOSIS — E559 Vitamin D deficiency, unspecified: Secondary | ICD-10-CM | POA: Diagnosis not present

## 2021-05-28 MED ORDER — CYCLOBENZAPRINE HCL 10 MG PO TABS: 10.0000 mg | ORAL_TABLET | Freq: Three times a day (TID) | ORAL | 0 refills | Status: DC | PRN
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
## 2021-05-28 NOTE — Patient Instructions (Signed)
Vitamin D Deficiency Vitamin D deficiency is when your body does not have enough vitamin D. Vitamin D is important to your body because: It helps your body use other minerals. It helps to keep your bones strong and healthy. It may help to prevent some diseases. It helps your heart and other muscles work well. Not getting enough vitamin D can make your bones soft. It can also cause other health problems. What are the causes? This condition may be caused by: Not eating enough foods that contain vitamin D. Not getting enough sun. Having diseases that make it hard for your body to absorb vitamin D. Having a surgery in which a part of the stomach or a part of the small intestine is removed. Having kidney disease or liver disease. What increases the risk? You are more likely to get this condition if: You are older. You do not spend much time outdoors. You live in a nursing home. You have had broken bones. You have weak or thin bones (osteoporosis). You have a disease or condition that changes how your body absorbs vitamin D. You have dark skin. You take certain medicines. You are overweight or obese. What are the signs or symptoms? In mild cases, there may not be any symptoms. If the condition is very bad, symptoms may include: Bone pain. Muscle pain. Falling often. Broken bones caused by a minor injury. How is this treated? Treatment may include taking supplements as told by your doctor. Your doctor will tell you what dose is best for you. Supplements may include: Vitamin D. Calcium. Follow these instructions at home: Eating and drinking  Eat foods that contain vitamin D, such as: Dairy products, cereals, or juices with added vitamin D. Check the label. Fish, such as salmon or trout. Eggs. Oysters. Mushrooms. The items listed above may not be a complete list of what you can eat and drink. Contact a dietitian for more options. General instructions Take medicines and  supplements only as told by your doctor. Get regular, safe exposure to natural sunlight. Do not use a tanning bed. Maintain a healthy weight. Lose weight if needed. Keep all follow-up visits as told by your doctor. This is important. How is this prevented? You can get vitamin D by: Eating foods that naturally contain vitamin D. Eating or drinking products that have vitamin D added to them, such as cereals, juices, and milk. Taking vitamin D or a multivitamin that contains vitamin D. Being in the sun. Your body makes vitamin D when your skin is exposed to sunlight. Your body changes the sunlight into a form of the vitamin that it can use. Contact a doctor if: Your symptoms do not go away. You feel sick to your stomach (nauseous). You throw up (vomit). You poop less often than normal, or you have trouble pooping (constipation). Summary Vitamin D deficiency is when your body does not have enough vitamin D. Vitamin D helps to keep your bones strong and healthy. This condition is often treated by taking a supplement. Your doctor will tell you what dose is best for you. This information is not intended to replace advice given to you by your health care provider. Make sure you discuss any questions you have with your health care provider. Document Revised: 05/03/2018 Document Reviewed: 05/03/2018 Elsevier Patient Education  2022 Elsevier Inc.  

## 2021-05-29 LAB — VITAMIN D 25 HYDROXY (VIT D DEFICIENCY, FRACTURES): Vit D, 25-Hydroxy: 100 ng/mL (ref 30.0–100.0)

## 2021-11-27 ENCOUNTER — Encounter: Payer: Self-pay | Admitting: Internal Medicine

## 2021-11-27 ENCOUNTER — Ambulatory Visit (INDEPENDENT_AMBULATORY_CARE_PROVIDER_SITE_OTHER): Payer: PRIVATE HEALTH INSURANCE | Admitting: Nurse Practitioner

## 2021-11-27 ENCOUNTER — Other Ambulatory Visit: Payer: Self-pay

## 2021-11-27 VITALS — BP 122/80 | Temp 98.1°F | Ht 65.4 in | Wt 153.4 lb

## 2021-11-27 DIAGNOSIS — R079 Chest pain, unspecified: Secondary | ICD-10-CM

## 2021-11-27 DIAGNOSIS — E559 Vitamin D deficiency, unspecified: Secondary | ICD-10-CM | POA: Diagnosis not present

## 2021-11-27 DIAGNOSIS — R002 Palpitations: Secondary | ICD-10-CM

## 2021-11-27 NOTE — Progress Notes (Signed)
?I,Yamilka J Llittleton,acting as a Education administrator for [employer], FNP.,have documented all relevant documentation on the behalf of Minette Brine, FNP,as directed by  Minette Brine, FNP while in the presence of Minette Brine, [place].  ? ?This visit occurred during the SARS-CoV-2 public health emergency.  Safety protocols were in place, including screening questions prior to the visit, additional usage of staff PPE, and extensive cleaning of exam room while observing appropriate contact time as indicated for disinfecting solutions. ? ?Subjective:  ?  ? Patient ID: Anne Duncan , female    DOB: 1961/02/05 , 61 y.o.   MRN: [id number] ? ? ?Chief Complaint  ?Patient presents with  ? vitamin d recheck  ? ? ?HPI ? ?Patient presents today for a vitamin d recheck. Pt also complains of having some shooting pains to her jaws with chest pain and flutters, she also had heaviness to her left arm that started last week. She reports she has always had heart flutters but last week it was worse. She feels like the symptoms are coming on more frequently. She also had dental work done 2 weeks ago but was still having chest pain. Denies shortness. Denies any previous history of heart problems. ?  ? ?Past Medical History:  ?Diagnosis Date  ? Dyspnea   ? GERD (gastroesophageal reflux disease)   ? Sarcoidosis   ?  ? ?Family History  ?Problem Relation Age of Onset  ? Asthma Sister   ? Cancer Mother   ? Hypertension Brother   ? Hypertension Sister   ? ? ? ?Current Outpatient Medications:  ?  cyclobenzaprine (FLEXERIL) 10 MG tablet, Take 1 tablet (10 mg total) by mouth 3 (three) times daily as needed for muscle spasms., Disp: 30 tablet, Rfl: 0  ? ?Allergies  ?Allergen Reactions  ? Penicillins   ?  REACTION: intolerant  ?  ? ?Review of Systems  ?Constitutional: Negative.  Negative for fatigue.  ?Eyes: Negative.   ?Respiratory:  Negative for chest tightness.   ?Cardiovascular:  Negative for chest pain.  ?Musculoskeletal: Negative.   ?Psychiatric/Behavioral:  Negative.     ? ?Today's Vitals  ? 11/27/21 1436  ?BP: 122/80  ?Temp: 98.1 ?F (36.7 ?C)  ?Weight: 153 lb 6.4 oz (69.6 kg)  ?Height: 5' 5.4" (1.661 m)  ?PainSc: 0-No pain  ? ?Body mass index is 25.22 kg/m?.  ? ?Objective:  ?Physical Exam ?Vitals reviewed.  ?Constitutional:   ?   General: She is not in acute distress. ?   Appearance: Normal appearance. She is well-developed.  ?Cardiovascular:  ?   Rate and Rhythm: Normal rate and regular rhythm.  ?   Pulses: Normal pulses.     ?     Carotid pulses are 2+ on the right side and 2+ on the left side. ?   Heart sounds: Normal heart sounds. No murmur heard. ?Pulmonary:  ?   Effort: Pulmonary effort is normal. No respiratory distress.  ?   Breath sounds: Normal breath sounds. No wheezing.  ?Chest:  ?   Chest wall: No tenderness.  ?Skin: ?   General: Skin is warm and dry.  ?   Capillary Refill: Capillary refill takes less than 2 seconds.  ?Neurological:  ?   General: No focal deficit present.  ?   Mental Status: She is alert and oriented to person, place, and time.  ?   Cranial Nerves: No cranial nerve deficit.  ?Psychiatric:     ?   Mood and Affect: Mood normal.     ?  Behavior: Behavior normal.     ?   Thought Content: Thought content normal.     ?   Judgment: Judgment normal.  ?  ? ?   ?Assessment And Plan:  ?   ?1. Chest pain, unspecified type ?Comments: EKG done with NSR however previous EKGs showed left bundle branch block and left atrial enlargement. Will refer to Cardiology for further evaluation. Stressed the importance when having symptoms of jaw pain, chest pain radiating down arm and/or heaviness to go to ER for evaluation for early intervention ?- Ambulatory referral to Cardiology ?- CBC ? ?2. Palpitation ?Comments: Encouraged to avoid caffeine and to stay well hydrated with water.  ?- TSH ? ?3. Vitamin D deficiency ?Will check vitamin D level and supplement as needed.    ?Also encouraged to spend 15 minutes in the sun daily.  ?- VITAMIN D 25 Hydroxy (Vit-D  Deficiency, Fractures) ?  ? ? ?Patient was given opportunity to ask questions. Patient verbalized understanding of the plan and was able to repeat key elements of the plan. All questions were answered to their satisfaction.  ?Minette Brine, FNP  ? ?I, Minette Brine, FNP, have reviewed all documentation for this visit. The documentation on 11/27/21 for the exam, diagnosis, procedures, and orders are all accurate and complete.  ? ?IF YOU HAVE BEEN REFERRED TO A SPECIALIST, IT MAY TAKE 1-2 WEEKS TO SCHEDULE/PROCESS THE REFERRAL. IF YOU HAVE NOT HEARD FROM US/SPECIALIST IN TWO WEEKS, PLEASE GIVE [place] A CALL AT [phone].  ? ?THE PATIENT IS ENCOURAGED TO PRACTICE SOCIAL DISTANCING DUE TO THE COVID-19 PANDEMIC.   ?

## 2021-11-27 NOTE — Patient Instructions (Addendum)
Palpitations ?Palpitations are feelings that your heartbeat is not normal. Your heartbeat may feel like it is: ?Uneven (irregular). ?Faster than normal. ?Fluttering. ?Skipping a beat. ?This is usually not a serious problem. However, a doctor will do tests and check your medical history to make sure that you do not have a serious heart problem. ?Follow these instructions at home: ?Watch for any changes in your condition. Tell your doctor about any changes. Take these actions to help manage your symptoms: ?Eating and drinking ?Follow instructions from your doctor about things to eat and drink. You may be told to avoid these things: ?Drinks that have caffeine in them, such as coffee, tea, soft drinks, and energy drinks. ?Chocolate. ?Alcohol. ?Diet pills. ?Lifestyle ?  ?Try to lower your stress. These things can help you relax: ?Yoga. ?Deep breathing and meditation. ?Guided imagery. This is using words and images to create positive thoughts. ?Exercise, including swimming, jogging, and walking. Tell your doctor if you have more abnormal heartbeats when you are active. If you have chest pain or feel short of breath with exercise, do not keep doing the exercise until you are seen by your doctor. ?Biofeedback. This is using your mind to control things in your body, such as your heartbeat. ?Get plenty of rest and sleep. Keep a regular bed time. ?Do not use drugs, such as cocaine or ecstasy. Do not use marijuana. ?Do not smoke or use any products that contain nicotine or tobacco. If you need help quitting, ask your doctor. ?General instructions ?Take over-the-counter and prescription medicines only as told by your doctor. ?Keep all follow-up visits. You may need more tests if palpitations do not go away or get worse. ?Contact a doctor if: ?You keep having fast or uneven heartbeats for a long time. ?Your symptoms happen more often. ?Get help right away if: ?You have chest pain. ?You feel short of breath. ?You have a very bad  headache. ?You feel dizzy. ?You faint. ?These symptoms may be an emergency. Get help right away. Call your local emergency services (911 in the [place] ?Do not wait to see if the symptoms will go away. ?Do not drive yourself to the hospital. ?Summary ?Palpitations are feelings that your heartbeat is uneven or faster than normal. It may feel like your heart is fluttering or skipping a beat. ?Avoid food and drinks that may cause this condition. These include caffeine, chocolate, and alcohol. ?Try to lower your stress. Do not smoke or use drugs. ?Get help right away if you faint, feel dizzy, feel short of breath, have chest pain, or have a very bad headache. ?This information is not intended to replace advice given to you by your health care provider. Make sure you discuss any questions you have with your health care provider. ?Document Revised: 01/16/2021 Document Reviewed: 01/16/2021 ?Elsevier Patient Education ? 2022 Elsevier Inc. ? ? ?Nonspecific Chest Pain ?Chest pain can be caused by many different conditions. Some causes of chest pain can be life-threatening. These will require treatment right away. Serious causes of chest pain include: ?Heart attack. ?A tear in the body's main blood vessel. ?Redness and swelling (inflammation) around your heart. ?Blood clot in your lungs. ?Other causes of chest pain may not be so serious. These include: ?Heartburn. ?Anxiety or stress. ?Damage to bones or muscles in your chest. ?Lung infections. ?Chest pain can feel like: ?Pain or discomfort in your chest. ?Crushing, pressure, aching, or squeezing pain. ?Burning or tingling. ?Dull or sharp pain that is worse when you move, cough,  or take a deep breath. ?Pain or discomfort that is also felt in your back, neck, jaw, shoulder, or arm, or pain that spreads to any of these areas. ?It is hard to know whether your pain is caused by something that is serious or something that is not so serious. So it is important to see your doctor right  away if you have chest pain. ?Follow these instructions at home: ?Medicines ?Take over-the-counter and prescription medicines only as told by your doctor. ?If you were prescribed an antibiotic medicine, take it as told by your doctor. Do not stop taking the antibiotic even if you start to feel better. ?Lifestyle ? ?Rest as told by your doctor. ?Do not use any products that contain nicotine or tobacco, such as cigarettes, e-cigarettes, and chewing tobacco. If you need help quitting, ask your doctor. ?Do not drink alcohol. ?Make lifestyle changes as told by your doctor. These may include: ?Getting regular exercise. Ask your doctor what activities are safe for you. ?Eating a heart-healthy diet. A diet and nutrition specialist (dietitian) can help you to learn healthy eating options. ?Staying at a healthy weight. ?Treating diabetes or high blood pressure, if needed. ?Lowering your stress. Activities such as yoga and relaxation techniques can help. ?General instructions ?Pay attention to any changes in your symptoms. Tell your doctor about them or any new symptoms. ?Avoid any activities that cause chest pain. ?Keep all follow-up visits as told by your doctor. This is important. You may need more testing if your chest pain does not go away. ?Contact a doctor if: ?Your chest pain does not go away. ?You feel depressed. ?You have a fever. ?Get help right away if: ?Your chest pain is worse. ?You have a cough that gets worse, or you cough up blood. ?You have very bad (severe) pain in your belly (abdomen). ?You pass out (faint). ?You have either of these for no clear reason: ?Sudden chest discomfort. ?Sudden discomfort in your arms, back, neck, or jaw. ?You have shortness of breath at any time. ?You suddenly start to sweat, or your skin gets clammy. ?You feel sick to your stomach (nauseous). ?You throw up (vomit). ?You suddenly feel lightheaded or dizzy. ?You feel very weak or tired. ?Your heart starts to beat fast, or it feels  like it is skipping beats. ?These symptoms may be an emergency. Do not wait to see if the symptoms will go away. Get medical help right away. Call your local emergency services (911 in the [place] Do not drive yourself to the hospital. ?Summary ?Chest pain can be caused by many different conditions. The cause may be serious and need treatment right away. If you have chest pain, see your doctor right away. ?Follow your doctor's instructions for taking medicines and making lifestyle changes. ?Keep all follow-up visits as told by your doctor. This includes visits for any further testing if your chest pain does not go away. ?Be sure to know the signs that show that your condition has become worse. Get help right away if you have these symptoms. ?This information is not intended to replace advice given to you by your health care provider. Make sure you discuss any questions you have with your health care provider. ?Document Revised: 11/08/2020 Document Reviewed: 11/08/2020 ?Elsevier Patient Education ? 2022 Elsevier Inc. ? ?

## 2021-11-28 LAB — VITAMIN D 25 HYDROXY (VIT D DEFICIENCY, FRACTURES): Vit D, 25-Hydroxy: 27.1 ng/mL — ABNORMAL LOW (ref 30.0–100.0)

## 2021-11-28 LAB — CBC
Hematocrit: 35.8 % (ref 34.0–46.6)
Hemoglobin: 11.5 g/dL (ref 11.1–15.9)
MCH: 27.9 pg (ref 26.6–33.0)
MCHC: 32.1 g/dL (ref 31.5–35.7)
MCV: 87 fL (ref 79–97)
Platelets: 310 10*3/uL (ref 150–450)
RBC: 4.12 x10E6/uL (ref 3.77–5.28)
RDW: 12.9 % (ref 11.7–15.4)
WBC: 5.7 10*3/uL (ref 3.4–10.8)

## 2021-11-28 LAB — TSH: TSH: 0.799 u[IU]/mL (ref 0.450–4.500)

## 2021-12-04 ENCOUNTER — Ambulatory Visit: Payer: PRIVATE HEALTH INSURANCE | Admitting: Internal Medicine

## 2021-12-04 NOTE — Progress Notes (Deleted)
?Cardiology Office Note:   ? ?Date:  12/04/2021  ? ?ID:  Anne Duncan, DOB 10-Jun-1961, MRN [id number] ? ?PCP:  Arnette Felts, FNP ?  ?[facility] Providers ?Cardiologist:  None { ?Click to update primary MD,subspecialty MD or APP then REFRESH:1}   ? ?Referring MD: Arnette Felts, FNP  ? ?No chief complaint on file. ?*** ? ?History of Present Illness:   ? ?Anne Duncan is a 61 y.o. female with a hx of sarcoidosis, GERD, referral for chest pain ? ? ? ? ?Prior EKGs show NSR. LAFB.  ? ?Past Medical History:  ?Diagnosis Date  ? Dyspnea   ? GERD (gastroesophageal reflux disease)   ? Sarcoidosis   ? ? ?Past Surgical History:  ?Procedure Laterality Date  ? LAPAROSCOPY    ? removal bone spurs right shoulder    ? tubal ligation and reversal    ? ? ?Current Medications: ?No outpatient medications have been marked as taking for the 12/04/21 encounter (Appointment) with Maisie Fus, MD.  ?  ? ?Allergies:   Penicillins  ? ?Social History  ? ?Socioeconomic History  ? Marital status: Married  ?  Spouse name: Renae Fickle  ? Number of children: 0  ? Years of education: 65  ? Highest education level: Not on file  ?Occupational History  ?  Comment: [employer]  ?Tobacco Use  ? Smoking status: Former  ?  Packs/day: 0.10  ?  Years: 12.00  ?  Pack years: 1.20  ?  Types: Cigarettes  ?  Quit date: 07/22/1995  ?  Years since quitting: 26.3  ? Smokeless tobacco: Never  ?Vaping Use  ? Vaping Use: Never used  ?Substance and Sexual Activity  ? Alcohol use: No  ? Drug use: No  ? Sexual activity: Not on file  ?Other Topics Concern  ? Not on file  ?Social History Narrative  ? Married, lives with husband  ? 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 No children  ? No recent travel  ? ?Social Determinants of Health  ? ?Financial Resource Strain: Not on file  ?Food Insecurity: Not on file  ?Transportation Needs: Not on file  ?Physical Activity: Not on file  ?Stress: Not on file  ?Social Connections: Not on file  ?   ? ?Family History: ?The patient's ***family history includes Asthma in her sister; Cancer in her mother; Hypertension in her brother and sister. ? ?ROS:   ?Please see the history of present illness.    ?*** All other systems reviewed and are negative. ? ?EKGs/Labs/Other Studies Reviewed:   ? ?The following studies were reviewed today: ?*** ? ?EKG:  EKG is *** ordered today.  The ekg ordered today demonstrates *** ? ?Recent Labs: ?11/27/2021: Hemoglobin 11.5; Platelets 310; TSH 0.799  ?Recent Lipid Panel ?   ?Component Value Date/Time  ? CHOL 191 11/21/2020 1540  ? TRIG 62 11/21/2020 1540  ? HDL 82 11/21/2020 1540  ? CHOLHDL 2.3 11/21/2020 1540  ? LDLCALC 97 11/21/2020 1540  ? ? ? ?Risk Assessment/Calculations:   ?{Does this patient have ATRIAL FIBRILLATION?:[id number]} ? ?    ? ?Physical Exam:   ? ?VS:  There were no vitals taken for this visit.   ? ?Wt Readings from Last 3 Encounters:  ?11/27/21 153 lb 6.4 oz (69.6 kg)  ?05/28/21 151 lb 3.2 oz (68.6 kg)  ?01/23/21 147 lb (66.7 kg)  ?  ? ?GEN: *** Well nourished, well developed in no acute  distress ?HEENT: Normal ?NECK: No JVD; No carotid bruits ?LYMPHATICS: No lymphadenopathy ?CARDIAC: ***RRR, no murmurs, rubs, gallops ?RESPIRATORY:  Clear to auscultation without rales, wheezing or rhonchi  ?ABDOMEN: Soft, non-tender, non-distended ?MUSCULOSKELETAL:  No edema; No deformity  ?SKIN: Warm and dry ?NEUROLOGIC:  Alert and oriented x 3 ?PSYCHIATRIC:  Normal affect  ? ?ASSESSMENT:   ? ?No diagnosis found. ?PLAN:   ? ?In order of problems listed above: ? ?*** ? ?   ? ?{Are you ordering a CV Procedure (e.g. stress test, cath, DCCV, TEE, etc)?   Press F2        :[id number]}  ? ? ?Medication Adjustments/Labs and Tests Ordered: ?Current medicines are reviewed at length with the patient today.  Concerns regarding medicines are outlined above.  ?No orders of the defined types were placed in this encounter. ? ?No orders of the defined types were placed in this  encounter. ? ? ?There are no Patient Instructions on file for this visit.  ? ?Signed, ?Maisie Fus, MD  ?12/04/2021 2:36 PM    ?[facility] ?

## 2021-12-17 ENCOUNTER — Ambulatory Visit (INDEPENDENT_AMBULATORY_CARE_PROVIDER_SITE_OTHER): Payer: PRIVATE HEALTH INSURANCE | Admitting: Internal Medicine

## 2021-12-17 ENCOUNTER — Encounter: Payer: Self-pay | Admitting: Internal Medicine

## 2021-12-17 ENCOUNTER — Ambulatory Visit (INDEPENDENT_AMBULATORY_CARE_PROVIDER_SITE_OTHER): Payer: PRIVATE HEALTH INSURANCE

## 2021-12-17 VITALS — BP 122/86 | HR 68 | Ht 65.4 in | Wt 152.0 lb

## 2021-12-17 DIAGNOSIS — R002 Palpitations: Secondary | ICD-10-CM

## 2021-12-17 NOTE — Progress Notes (Unsigned)
Enrolled patient for a 7 day ZIo XT monitor to be mailed to patients home  °

## 2021-12-17 NOTE — Progress Notes (Signed)
?Cardiology Office Note:   ? ?Date:  12/17/2021  ? ?ID:  Anne Duncan, DOB 1961/01/28, MRN [id number] ? ?PCP:  Minette Brine, FNP ?  ?[facility] Providers ?Cardiologist:  Janina Mayo, MD    ? ?Referring MD: Minette Brine, FNP  ? ?No chief complaint on file. ?Palpitations; atypical CP ? ?History of Present Illness:   ? ?Anne Duncan is a 61 y.o. female with a hx of GERD, sarcoidosis, referral for chest pain ? ?Noted sharp pains and arm heaviness with her primary provider. She had NSR EKG. 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 She was counseled to cut back on caffeine.   ? ?She notes she had sharp pain to both sides of her jaw. Her arm got heavy. She was going to go to urgent care, but it stopped. She recently had a dental procedure. She notes frequent fluttering. She notes some light headedness. If she breathes heavy, she can note sharp pains in the chest. She gets SOB with the mask. No leg swelling in the legs. No orthopnea or PND. She does have hx of sarcoidosis. She's had no flairs. In the past, she had a fainting spell. She notes she heard people talking and could not react. She had a blank stare. 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 No family hx of SCD. Mother had heart disease. No heart disease for her father.  TSH is normal ? ?The 10-year ASCVD risk score (Arnett DK, et al., 2019) is: 6.7% ?  Values used to calculate the score: ?    Age: 5 years ?    Sex: Female ?    Is Non-Hispanic African American: Yes ?    Diabetic: No ?    Tobacco smoker: Yes ?    Systolic Blood Pressure: 315 mmHg ?    Is BP treated: No ?    HDL Cholesterol: 82 mg/dL ?    Total Cholesterol: 191 mg/dL ? ? ?Past Medical History:  ?Diagnosis Date  ? Dyspnea   ? GERD (gastroesophageal reflux disease)   ? Sarcoidosis   ? ? ?Past Surgical History:  ?Procedure Laterality Date  ? LAPAROSCOPY    ? removal bone spurs right shoulder    ? tubal ligation and reversal    ? ? ?Current Medications: ?Current Meds  ?Medication Sig  ?  Cyanocobalamin (VITAMIN B 12 PO) Take 1 tablet by mouth daily in the afternoon.  ? VITAMIN D PO Take 3,000 Units by mouth daily in the afternoon.  ?  ? ?Allergies:   Penicillins  ? ?Social History  ? ?Socioeconomic History  ? Marital status: Married  ?  Spouse name: Eddie Dibbles  ? Number of children: 0  ? Years of education: 16  ? Highest education level: Not on file  ?Occupational History  ?  Comment: [employer]  ?Tobacco Use  ? Smoking status: Former  ?  Packs/day: 0.10  ?  Years: 12.00  ?  Pack years: 1.20  ?  Types: Cigarettes  ?  Quit date: 07/22/1995  ?  Years since quitting: 26.4  ? Smokeless tobacco: Never  ?Vaping Use  ? Vaping Use: Never used  ?Substance and Sexual Activity  ? Alcohol use: No  ? Drug use: No  ? Sexual activity: Not on file  ?Other Topics Concern  ? Not on file  ?Social History Narrative  ? Married, lives with husband  ? 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 No children  ?  No recent travel  ? ?Social Determinants of Health  ? ?Financial Resource Strain: Not on file  ?Food Insecurity: Not on file  ?Transportation Needs: Not on file  ?Physical Activity: Not on file  ?Stress: Not on file  ?Social Connections: Not on file  ?  ? ?Family History: ?The patient's family history includes Asthma in her sister; Cancer in her mother; Hypertension in her brother and sister. ? ?ROS:   ?Please see the history of present illness.    ? All other systems reviewed and are negative. ? ?EKGs/Labs/Other Studies Reviewed:   ? ?The following studies were reviewed today: ? ? ?EKG:  EKG is  ordered today.  The ekg ordered today demonstrates  ? ?EKG 12/17/2021- NSR, IRBBB, LAD ? ?Recent Labs: ?11/27/2021: Hemoglobin 11.5; Platelets 310; TSH 0.799  ?Recent Lipid Panel ?   ?Component Value Date/Time  ? CHOL 191 11/21/2020 1540  ? TRIG 62 11/21/2020 1540  ? HDL 82 11/21/2020 1540  ? CHOLHDL 2.3 11/21/2020 1540  ? [place] 97 11/21/2020 1540  ? ? ? ?Risk Assessment/Calculations:   ?  ? ?     ? ?Physical Exam:   ? ?VS:   ?Vitals:  ? 12/17/21 1103  ?BP: 122/86  ?Pulse: 68  ?SpO2: 95%  ? ?  ? ?Wt Readings from Last 3 Encounters:  ?12/17/21 152 lb (68.9 kg)  ?11/27/21 153 lb 6.4 oz (69.6 kg)  ?05/28/21 151 lb 3.2 oz (68.6 kg)  ?  ? ?GEN:  Well nourished, well developed in no acute distress ?HEENT: Normal ?NECK: No JVD; No carotid bruits ?LYMPHATICS: No lymphadenopathy ?CARDIAC: RRR, no murmurs, rubs, gallops ?RESPIRATORY:  Clear to auscultation without rales, wheezing or rhonchi  ?ABDOMEN: Soft, non-tender, non-distended ?MUSCULOSKELETAL:  No edema; No deformity  ?SKIN: Warm and dry ?NEUROLOGIC:  Alert and oriented x 3 ?PSYCHIATRIC:  Normal affect  ? ?ASSESSMENT:   ? ?#Palpitations: She notes fluttering/palpitations and LH. EKG shows incomplete RBBB/LAD. NSR. This is benign. Will get cardiac monitor to ensure she has no signs of concerning arrhythmia. If so would be concerned for cardiac sarcoid and plan for PET. In the future, if she met criteria for pacemaker and PET c/f cardiac sarcoid would recommend ICD. Otherwise, her chest pain symptoms were brief and atypical. ? ?PLAN:   ? ?In order of problems listed above: ? ?7 day ziopatch ?Follow up in 3 months ? ?   ? ?   ?Medication Adjustments/Labs and Tests Ordered: ?Current medicines are reviewed at length with the patient today.  Concerns regarding medicines are outlined above.  ?Orders Placed This Encounter  ?Procedures  ? LONG TERM MONITOR (3-14 DAYS)  ? EKG 12-Lead  ? ?No orders of the defined types were placed in this encounter. ? ? ?Patient Instructions  ?Medication Instructions:  ?No Changes In Medications at this time.  ?*If you need a refill on your cardiac medications before your next appointment, please call your pharmacy* ? ?Testing/Procedures: ? ?ZIO XT- Long Term Monitor Instructions  ? ?Your physician has requested you wear your ZIO patch monitor___7____days.  ? ?This is a single patch monitor.  [employer] supplies one patch monitor per  enrollment.  Additional stickers are not available. ?  ?Please do not apply patch if you will be having a Nuclear Stress Test, Echocardiogram, Cardiac CT, MRI, or Chest Xray during the time frame you would be wearing the monitor. The patch cannot be worn during these tests.  You cannot remove and re-apply the ZIO XT patch monitor. ?  ?  Your ZIO patch monitor will be sent USPS Priority mail from [employer] directly to your home address. The monitor may also be mailed to a PO BOX if home delivery is not available.   It may take 3-5 days to receive your monitor after you have been enrolled. ?  ?Once you have received you monitor, please review enclosed instructions.  Your monitor has already been registered assigning a specific monitor serial # to you. ?  ?Applying the monitor  ? ?Shave hair from upper left chest. ?  ?Hold abrader disc by orange tab.  Rub abrader in 40 strokes over left upper chest as indicated in your monitor instructions. ?  ?Clean area with 4 enclosed alcohol pads .  Use all pads to assure are is cleaned thoroughly.  Let dry.  ? ?Apply patch as indicated in monitor instructions.  Patch will be place under collarbone on left side of chest with arrow pointing upward. ?  ?Rub patch adhesive wings for 2 minutes.Remove white label marked "1".  Remove white label marked "2".  Rub patch adhesive wings for 2 additional minutes. ?  ?While looking in a mirror, press and release button in center of patch.  A small green light will flash 3-4 times .  This will be your only indicator the monitor has been turned on. ?    ?Do not shower for the first 24 hours.  You may shower after the first 24 hours. ?  ?Press button if you feel a symptom. You will hear a small click.  Record Date, Time and Symptom in the Patient Log Book. ?  ?When you are ready to remove patch, follow instructions on last 2 pages of Patient Log Book.  Stick patch monitor onto last page of Patient Log Book. ?  ?Place Patient Log Book in  [facility] box.  Use locking tab on box and tape box closed securely.  The [employer] has [employer] on it.  Please place in mailbox as soon as possible.  Your physician should have your test results appro

## 2021-12-17 NOTE — Patient Instructions (Signed)
Medication Instructions:  °No Changes In Medications at this time.  °*If you need a refill on your cardiac medications before your next appointment, please call your pharmacy* ° °Testing/Procedures: ° °ZIO XT- Long Term Monitor Instructions  ° °Your physician has requested you wear your ZIO patch monitor 7 days.  ° °This is a single patch monitor.  [employer] supplies one patch monitor per enrollment.  Additional stickers are not available. °  °Please do not apply patch if you will be having a Nuclear Stress Test, Echocardiogram, Cardiac CT, MRI, or Chest Xray during the time frame you would be wearing the monitor. The patch cannot be worn during these tests.  You cannot remove and re-apply the ZIO XT patch monitor. °  °Your ZIO patch monitor will be sent USPS Priority mail from [employer] directly to your home address. The monitor may also be mailed to a PO BOX if home delivery is not available.   It may take 3-5 days to receive your monitor after you have been enrolled. °  °Once you have received you monitor, please review enclosed instructions.  Your monitor has already been registered assigning a specific monitor serial # to you. °  °Applying the monitor  ° °Shave hair from upper left chest. °  °Hold abrader disc by orange tab.  Rub abrader in 40 strokes over left upper chest as indicated in your monitor instructions. °  °Clean area with 4 enclosed alcohol pads .  Use all pads to assure are is cleaned thoroughly.  Let dry.  ° °Apply patch as indicated in monitor instructions.  Patch will be place under collarbone on left side of chest with arrow pointing upward. °  °Rub patch adhesive wings for 2 minutes.Remove white label marked "1".  Remove white label marked "2".  Rub patch adhesive wings for 2 additional minutes. °  °While looking in a mirror, press and release button in center of patch.  A small green light will flash 3-4 times .  This will be your only indicator the monitor has been turned on. °     °Do not shower for the first 24 hours.  You may shower after the first 24 hours. °  °Press button if you feel a symptom. You will hear a small click.  Record Date, Time and Symptom in the Patient Log Book. °  °When you are ready to remove patch, follow instructions on last 2 pages of Patient Log Book.  Stick patch monitor onto last page of Patient Log Book. °  °Place Patient Log Book in Blue box.  Use locking tab on box and tape box closed securely.  The [employer] box has prepaid postage on it.  Please place in mailbox as soon as possible.  Your physician should have your test results approximately 7 days after the monitor has been mailed back to [employer]. °  °Call [employer] Customer Care at [phone] if you have questions regarding your ZIO XT patch monitor.  Call them immediately if you see an orange light blinking on your monitor. °  °If your monitor falls off in less than 4 days contact our Monitor department at [phone].  If your monitor becomes loose or falls off after 4 days call [employer] at [phone] for suggestions on securing your monitor.   ° °Follow-Up: °At [facility], you and your health needs are our priority.  As part of our continuing mission to provide you with exceptional heart care, we have created designated Provider   Care Teams.  These Care Teams include your primary Cardiologist (physician) and Advanced Practice Providers (APPs -  Physician Assistants and Nurse Practitioners) who all work together to provide you with the care you need, when you need it. ° °We recommend signing up for the patient portal called "MyChart".  Sign up information is provided on this After Visit Summary.  MyChart is used to connect with patients for Virtual Visits (Telemedicine).  Patients are able to view lab/test results, encounter notes, upcoming appointments, etc.  Non-urgent messages can be sent to your provider as well.   °To learn more about what you can do with MyChart, go to  [email or web address].   ° °Your next appointment:   °3 month(s) ° °The format for your next appointment:   °In Person ° °Provider:   °Branch, Mary E, MD   ° °

## 2021-12-25 DIAGNOSIS — R002 Palpitations: Secondary | ICD-10-CM | POA: Diagnosis not present

## 2022-01-29 ENCOUNTER — Encounter: Payer: PRIVATE HEALTH INSURANCE | Admitting: Nurse Practitioner

## 2022-02-23 IMAGING — DX DG CHEST 2V
2 series · 2 of 2 positions shown · non-contrast
Comparison: August 25, 2019.

CLINICAL DATA: Shortness of breath, sarcoidosis.

EXAM:
CHEST - 2 VIEW

[chest pa]
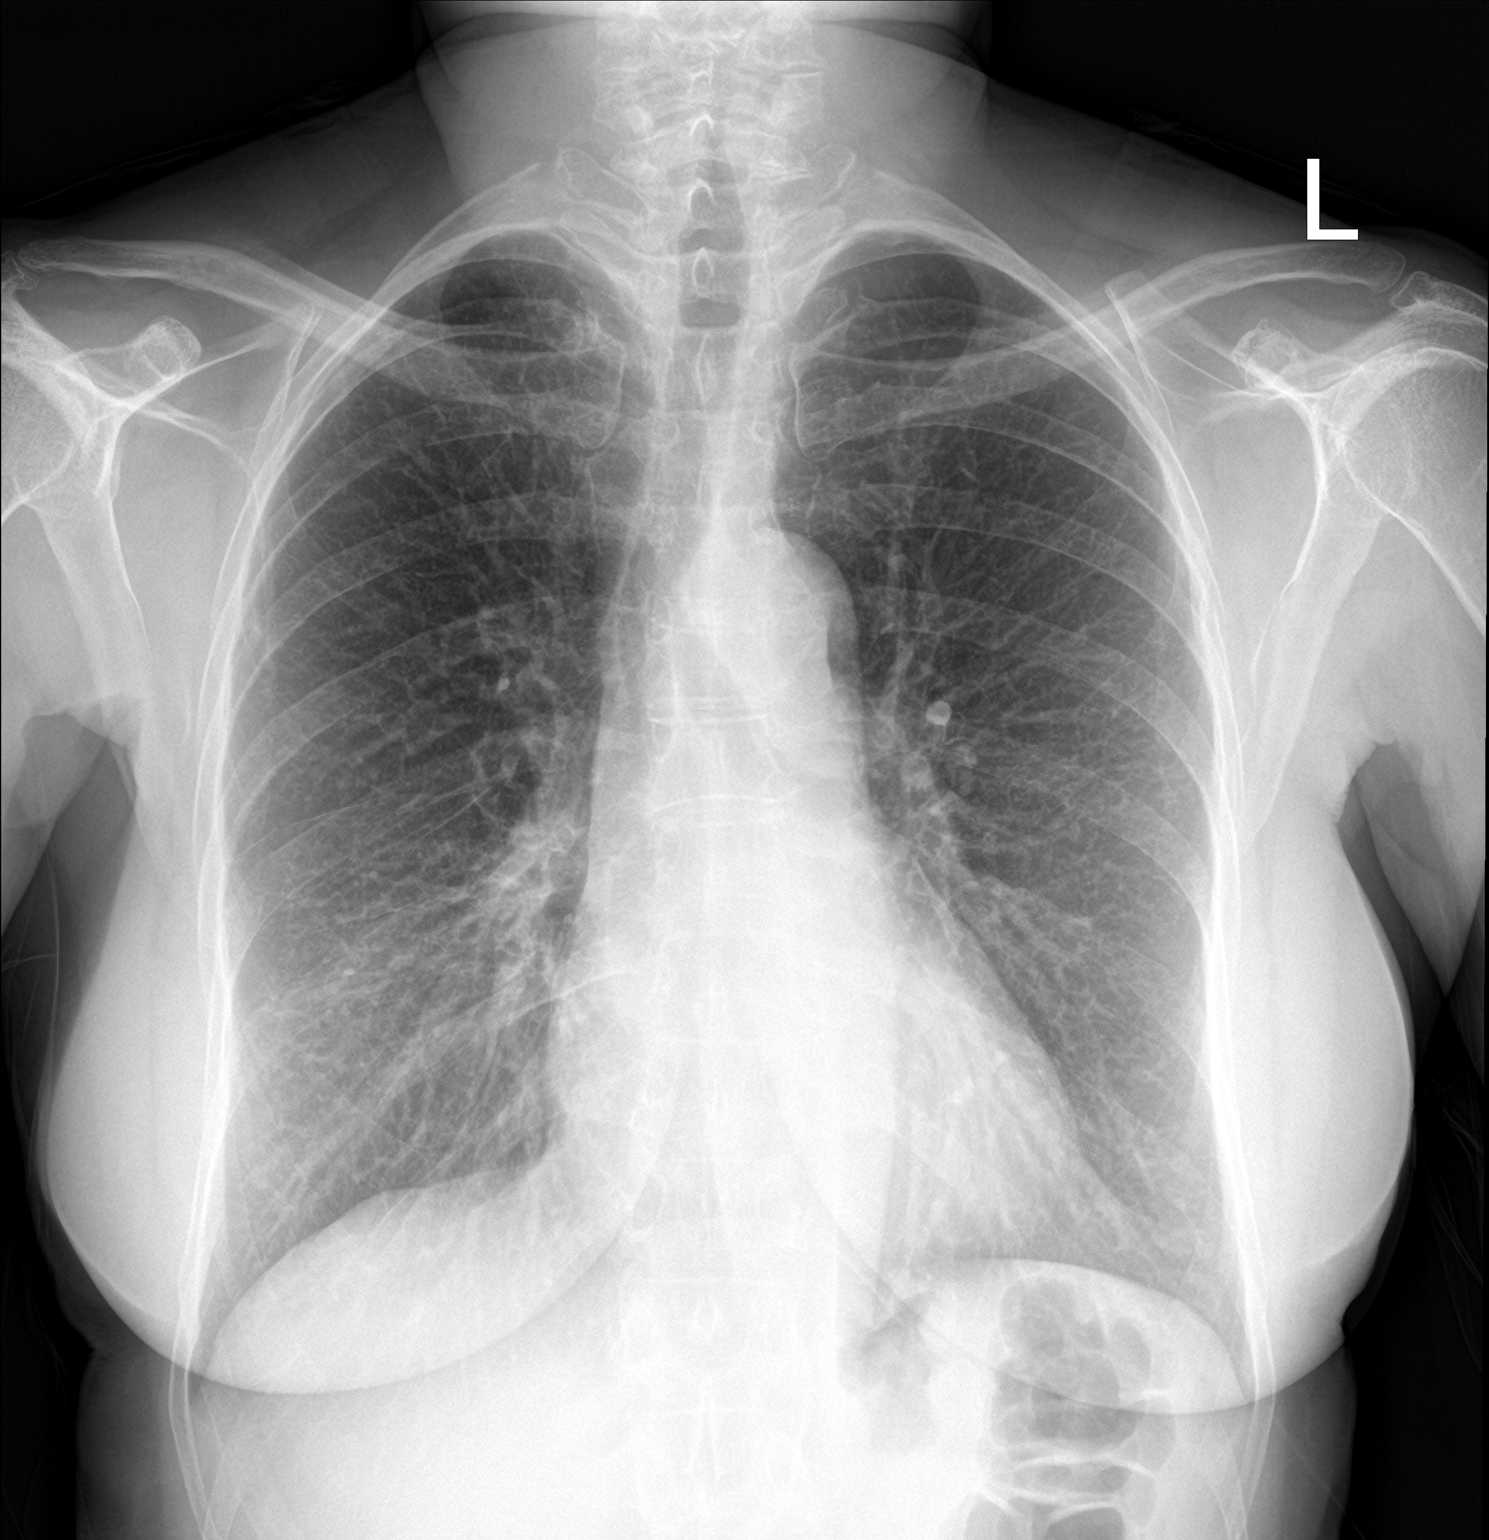

[chest lat]
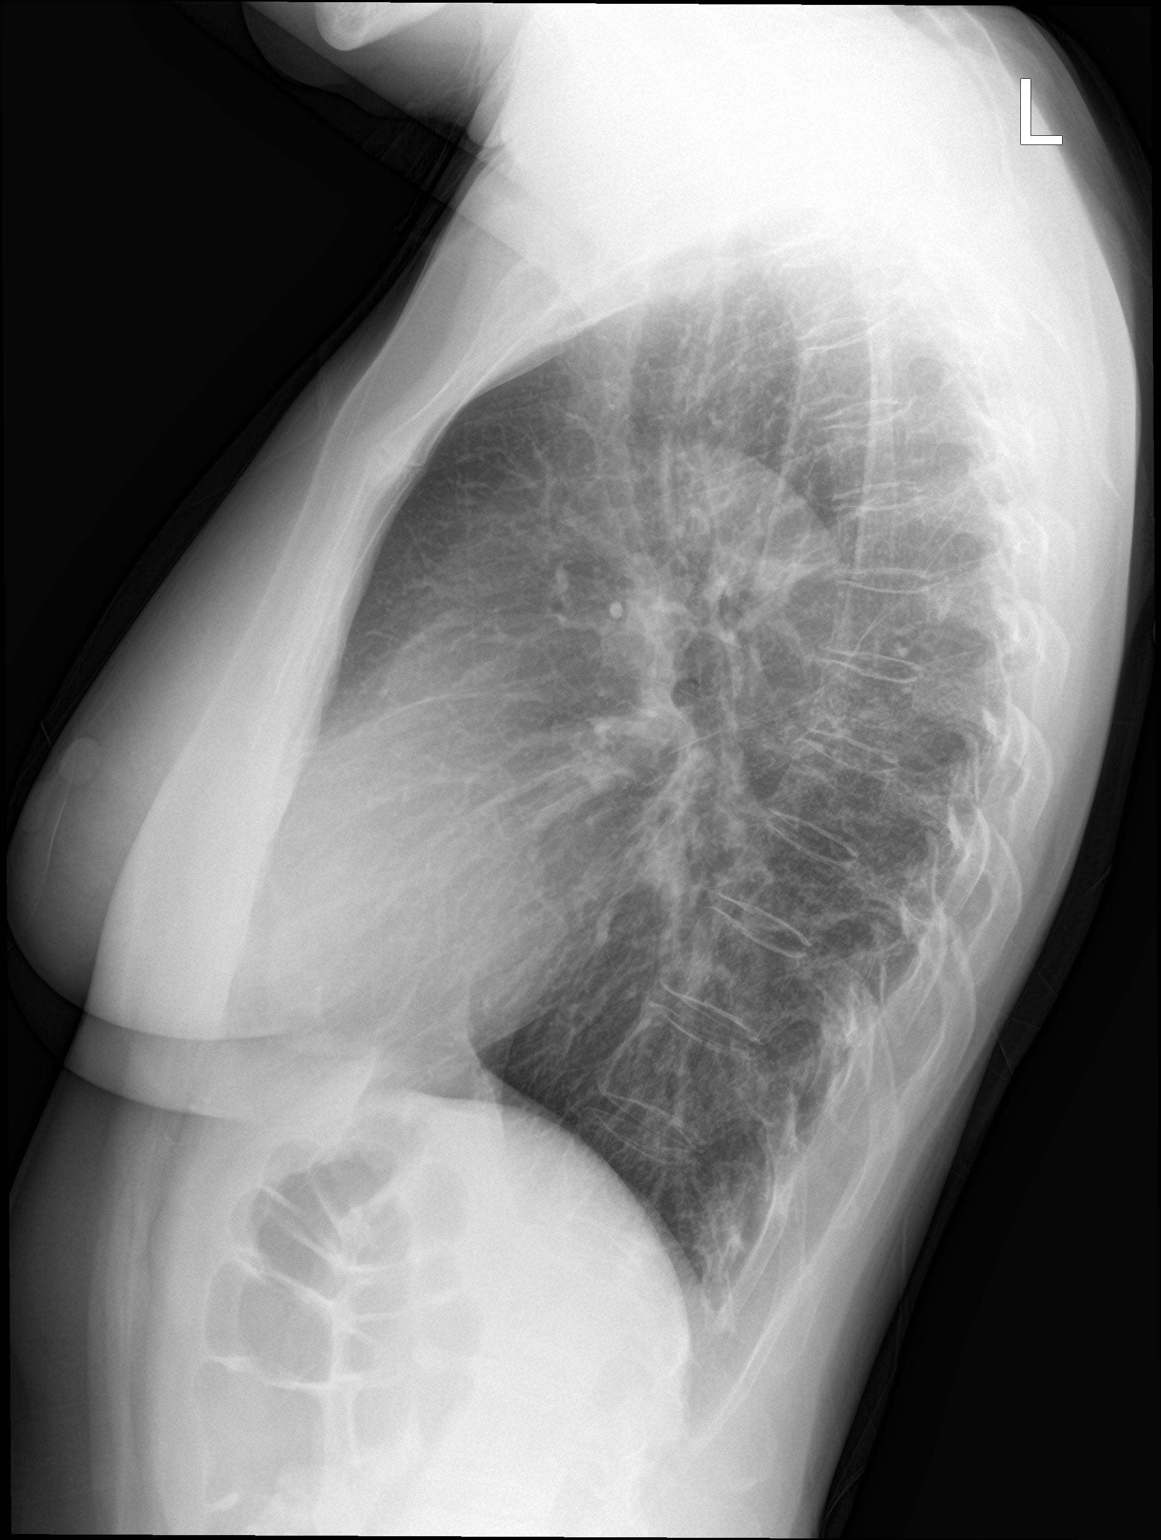

[2 of 2 positions shown; findings below may reference images not displayed]

FINDINGS: The heart size and mediastinal contours are within normal limits. No
pneumothorax or pleural effusion is noted. Stable chronic
interstitial densities are noted consistent with history of
sarcoidosis. No acute abnormality is noted. The visualized skeletal
structures are unremarkable.
IMPRESSION: Stable chronic interstitial densities consistent with history of
sarcoidosis.

## 2022-03-18 NOTE — Progress Notes (Signed)
Cardiology Office Note:    Date:  03/18/2022   ID:  [name], DOB [birth year], MRN [id number]  PCP:  Arnette Felts, FNP   [facility] Providers Cardiologist:  Maisie Fus, MD     Referring MD: Arnette Felts, FNP   No chief complaint on file. Palpitations; atypical CP  History of Present Illness:    Anne Duncan is a 61 y.o. female with a hx of GERD, sarcoidosis, referral for palpitations  Noted sharp pains and arm heaviness with her primary provider. She had NSR EKG. 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 She was counseled to cut back on caffeine.    She notes she had sharp pain to both sides of her jaw. Her arm got heavy. She was going to go to urgent care, but it stopped. She recently had a dental procedure. She notes frequent fluttering. She notes some light headedness. If she breathes heavy, she can note sharp pains in the chest. She gets SOB with the mask. No leg swelling in the legs. No orthopnea or PND. She does have hx of sarcoidosis. She's had no flairs. In the past, she had a fainting spell. She notes she heard people talking and could not react. She had a blank stare. 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 No family hx of SCD. Mother had heart disease. No heart disease for her father.  TSH is normal  The 10-year ASCVD risk score (Arnett DK, et al., 2019) is: 3.4%   Values used to calculate the score:     Age: 20 years     Sex: Female     Is Non-Hispanic African American: Yes     Diabetic: No     Tobacco smoker: No     Systolic Blood Pressure: 122 mmHg     Is BP treated: No     HDL Cholesterol: 82 mg/dL     Total Cholesterol: 191 mg/dL  Interim Hx: She underwent ziopatch 01/02/2022 for 1 day and 10 hours. This was normal.   Past Medical History:  Diagnosis Date   Dyspnea    GERD (gastroesophageal reflux disease)    Sarcoidosis     Past Surgical History:  Procedure Laterality Date   LAPAROSCOPY     removal bone spurs right shoulder     tubal  ligation and reversal      Current Medications: No outpatient medications have been marked as taking for the 03/19/22 encounter (Appointment) with Maisie Fus, MD.     Allergies:   Penicillins   Social History   Socioeconomic History   Marital status: Married    Spouse name: Renae Fickle   Number of children: 0   Years of education: 12   Highest education level: Not on file  Occupational History    Comment: [employer]  Tobacco Use   Smoking status: Former    Packs/day: 0.10    Years: 12.00    Total pack years: 1.20    Types: Cigarettes    Quit date: 07/22/1995    Years since quitting: 26.6   Smokeless tobacco: Never  Vaping Use   Vaping Use: Never used  Substance and Sexual Activity   Alcohol use: No   Drug use: No   Sexual activity: Not on file  Other Topics Concern   Not on file  Social History Narrative   Married, lives with husband   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  ROS:   Please see the history of present illness.     All other systems reviewed and are negative.  EKGs/Labs/Other Studies Reviewed:    The following studies were reviewed today:   EKG:  EKG is  ordered today.  The ekg ordered today demonstrates   EKG 12/17/2021- NSR, IRBBB, LAD  Recent Labs: 11/27/2021: Hemoglobin 11.5; Platelets 310; TSH 0.799  Recent Lipid Panel    Component Value Date/Time   CHOL 191 11/21/2020 1540   TRIG 62 11/21/2020 1540   HDL 82 11/21/2020 1540   CHOLHDL 2.3 11/21/2020 1540   LDLCALC 97 11/21/2020 1540     Risk  Assessment/Calculations:           Physical Exam:    VS:   There were no vitals filed for this visit.     Wt Readings from Last 3 Encounters:  12/17/21 152 lb (68.9 kg)  11/27/21 153 lb 6.4 oz (69.6 kg)  05/28/21 151 lb 3.2 oz (68.6 kg)     GEN:  Well nourished, well developed in no acute distress HEENT: Normal NECK: No JVD; No carotid bruits LYMPHATICS: No lymphadenopathy CARDIAC: RRR, no murmurs, rubs, gallops RESPIRATORY:  Clear to auscultation without rales, wheezing or rhonchi  ABDOMEN: Soft, non-tender, non-distended MUSCULOSKELETAL:  No edema; No deformity  SKIN: Warm and dry NEUROLOGIC:  Alert and oriented x 3 PSYCHIATRIC:  Normal affect   ASSESSMENT:    #Palpitations: She notes fluttering/palpitations and LH. EKG shows incomplete RBBB/LAD. NSR. This is benign. She only wore zio for 1 day. No arrhythmia.  Will get cardiac monitor to ensure she has no signs of concerning arrhythmia. If so would be concerned for cardiac sarcoid and plan for PET. In the future, if she met criteria for pacemaker and PET c/f cardiac sarcoid would recommend ICD. Otherwise, her chest pain symptoms were brief and atypical.  PLAN:    In order of problems listed above:  7 day ziopatch Follow up in 3 months         Medication Adjustments/Labs and Tests Ordered: Current medicines are reviewed at length with the patient today.  Concerns regarding medicines are outlined above.  No orders of the defined types were placed in this encounter.  No orders of the defined types were placed in this encounter.   There are no Patient Instructions on file for this visit.   Signed, Janina Mayo, MD  03/18/2022 3:02 PM    [facility]

## 2022-03-19 ENCOUNTER — Encounter: Payer: Self-pay | Admitting: Internal Medicine

## 2022-03-19 ENCOUNTER — Ambulatory Visit (INDEPENDENT_AMBULATORY_CARE_PROVIDER_SITE_OTHER): Payer: PRIVATE HEALTH INSURANCE | Admitting: Internal Medicine

## 2022-03-19 VITALS — BP 110/76 | HR 70 | Ht 66.0 in | Wt 152.0 lb

## 2022-03-19 DIAGNOSIS — R002 Palpitations: Secondary | ICD-10-CM

## 2022-03-19 NOTE — Patient Instructions (Signed)
Medication Instructions:  ?No Changes In Medications at this time.  ?*If you need a refill on your cardiac medications before your next appointment, please call your pharmacy* ? ?Lab Work: ?None Ordered At This Time.  ?If you have labs (blood work) drawn today and your tests are completely normal, you will receive your results only by: ?MyChart Message (if you have MyChart) OR ?A paper copy in the mail ?If you have any lab test that is abnormal or we need to change your treatment, we will call you to review the results. ? ?Testing/Procedures: ?None Ordered At This Time.  ? ?Follow-Up: ?At [facility], you and your health needs are our priority.  As part of our continuing mission to provide you with exceptional heart care, we have created designated Provider Care Teams.  These Care Teams include your primary Cardiologist (physician) and Advanced Practice Providers (APPs -  Physician Assistants and Nurse Practitioners) who all work together to provide you with the care you need, when you need it. ? ?Your next appointment:   ?6 month(s) ? ?The format for your next appointment:   ?In Person ? ?Provider:   ?Branch, Mary E, MD   ?

## 2022-05-05 ENCOUNTER — Ambulatory Visit (INDEPENDENT_AMBULATORY_CARE_PROVIDER_SITE_OTHER): Payer: PRIVATE HEALTH INSURANCE

## 2022-05-05 ENCOUNTER — Ambulatory Visit (INDEPENDENT_AMBULATORY_CARE_PROVIDER_SITE_OTHER): Payer: PRIVATE HEALTH INSURANCE | Admitting: Orthopaedic Surgery

## 2022-05-05 DIAGNOSIS — M25561 Pain in right knee: Secondary | ICD-10-CM

## 2022-05-05 DIAGNOSIS — M7051 Other bursitis of knee, right knee: Secondary | ICD-10-CM

## 2022-05-05 MED ORDER — LIDOCAINE HCL 1 % IJ SOLN
3.0000 mL | INTRAMUSCULAR | Status: AC | PRN
  Administered 2022-05-05: 3 mL

## 2022-05-05 MED ORDER — METHYLPREDNISOLONE ACETATE 40 MG/ML IJ SUSP
40.0000 mg | INTRAMUSCULAR | Status: AC | PRN
  Administered 2022-05-05: 40 mg via INTRA_ARTICULAR

## 2022-05-05 MED ORDER — DICLOFENAC SODIUM 75 MG PO TBEC: 75.0000 mg | DELAYED_RELEASE_TABLET | Freq: Two times a day (BID) | ORAL | 1 refills | Status: DC | PRN

## 2022-05-05 NOTE — Progress Notes (Signed)
Office Visit Note   Patient: Anne Duncan           Date of Birth: 03-23-1961           MRN: [id number] Visit Date: 05/05/2022              Requested by: Arnette Felts, FNP [address] [place],  [place] [address] PCP: Arnette Felts, FNP   Assessment & Plan: Visit Diagnoses:  1. Acute pain of right knee   2. Pes anserinus bursitis of right knee     Plan: Her clinical exam and signs and symptoms are consistent with pes bursitis of the right knee.  I want her to try Voltaren in this area twice daily in terms of Voltaren gel and I will try oral diclofenac as well.  I did offer her an injection of a steroid over the pes bursa.  She agreed to this and tolerated it well.  She will tell her facility since is a rehab facility what she has going on so they may be able to work with her.  However, we will see her back in 6 weeks because if she is not making progress we may send her to formal outpatient physical therapy.  All questions and concerns were answered and addressed.  Follow-Up Instructions: Return in about 6 weeks (around 06/16/2022).   Orders:  Orders Placed This Encounter  Procedures   Large Joint Inj   XR Knee 1-2 Views Right   Meds ordered this encounter  Medications   diclofenac (VOLTAREN) 75 MG EC tablet    Sig: Take 1 tablet (75 mg total) by mouth 2 (two) times daily as needed.    Dispense:  60 tablet    Refill:  1      Procedures: Large Joint Inj: R knee on 05/05/2022 10:27 AM Indications: diagnostic evaluation and pain Details: 22 G 1.5 in needle, superolateral approach  Arthrogram: No  Medications: 3 mL lidocaine 1 %; 40 mg methylPREDNISolone acetate 40 MG/ML Outcome: tolerated well, no immediate complications Procedure, treatment alternatives, risks and benefits explained, specific risks discussed. Consent was given by the patient. Immediately prior to procedure a time out was called to verify the correct patient, procedure, equipment, support staff  and site/side marked as required. Patient was prepped and draped in the usual sterile fashion.       Clinical Data: No additional findings.   Subjective: Chief Complaint  Patient presents with   Right Knee - Pain  The patient is a very active 61 year old female who comes in with acute right knee pain has been hurting for about a month now.  She points to the medial aspect of her knee as a source of her pain but is more at the pes bursa area where she points to.  There is been no known injury.  She does state that her mom has bad arthritis in her knees.  She does work at [employer] at [employer] and is on her feet quite a bit.  She reports now some left knee pain as well.  There is no locking catching.  She has not had any injury that she is aware of and has never had surgery on her right knee.  She is not diabetic.  HPI  Review of Systems There is currently listed no fever, chills, nausea, vomiting  Objective: Vital Signs: There were no vitals taken for this visit.  Physical Exam She is alert and orient x3 and in no acute  distress Ortho Exam Examination of the right knee shows no effusion with excellent range of motion.  The knee is loosely stable.  There is some slight swelling over the pes bursa area and this is where she is tender.  She has a negative Lachman's and negative McMurray's exam. Specialty Comments:  No specialty comments available.  Imaging: XR Knee 1-2 Views Right  Result Date: 05/05/2022 2 views of the right knee are negative.  The joint spaces well-maintained.    PMFS History: Patient Active Problem List   Diagnosis Date Noted   Vitamin D deficiency 01/23/2021   Costochondritis 08/13/2012   GASTRITIS 09/21/2008   GERD 07/19/2008   SARCOIDOSIS 07/18/2008   DYSPNEA 07/18/2008   Past Medical History:  Diagnosis Date   Dyspnea    GERD (gastroesophageal reflux disease)    Sarcoidosis     Family History  Problem Relation Age of Onset   Asthma  Sister    Cancer Mother    Hypertension Brother    Hypertension Sister     Past Surgical History:  Procedure Laterality Date   LAPAROSCOPY     removal bone spurs right shoulder     tubal ligation and reversal     Social History   Occupational History    Comment: Editor, commissioning and [employer]  Tobacco Use   Smoking status: Former    Packs/day: 0.10    Years: 12.00    Total pack years: 1.20    Types: Cigarettes    Quit date: 07/22/1995    Years since quitting: 26.8   Smokeless tobacco: Never  Vaping Use   Vaping Use: Never used  Substance and Sexual Activity   Alcohol use: No   Drug use: No   Sexual activity: Not on file

## 2022-05-21 ENCOUNTER — Encounter: Payer: PRIVATE HEALTH INSURANCE | Admitting: Nurse Practitioner

## 2022-06-16 ENCOUNTER — Ambulatory Visit: Payer: PRIVATE HEALTH INSURANCE | Admitting: Physician Assistant

## 2022-06-16 ENCOUNTER — Telehealth: Payer: Self-pay | Admitting: Radiology

## 2022-06-16 NOTE — Telephone Encounter (Signed)
Message left on triage line from this morning that patient was not going to be able to make her appointment today. She requested return call to reschedule.  CB [phone]  Ashley-sending to you as both [facility] and [facility] Del[facility] are full and I was unsure where you would like for me to make return appointment?

## 2022-06-25 ENCOUNTER — Encounter: Payer: Self-pay | Admitting: Orthopaedic Surgery

## 2022-06-25 ENCOUNTER — Ambulatory Visit (INDEPENDENT_AMBULATORY_CARE_PROVIDER_SITE_OTHER): Payer: PRIVATE HEALTH INSURANCE | Admitting: Orthopaedic Surgery

## 2022-06-25 DIAGNOSIS — M7061 Trochanteric bursitis, right hip: Secondary | ICD-10-CM | POA: Diagnosis not present

## 2022-06-25 MED ORDER — LIDOCAINE HCL 1 % IJ SOLN
3.0000 mL | INTRAMUSCULAR | Status: AC | PRN
  Administered 2022-06-25: 3 mL

## 2022-06-25 MED ORDER — METHYLPREDNISOLONE ACETATE 40 MG/ML IJ SUSP
40.0000 mg | INTRAMUSCULAR | Status: AC | PRN
  Administered 2022-06-25: 40 mg via INTRA_ARTICULAR

## 2022-06-25 NOTE — Progress Notes (Signed)
   Procedure Note  Patient: Anne Duncan             Date of Birth: 06/12/1961           MRN: [id number]             Visit Date: 06/25/2022  HPI: Anne Duncan returns today with right leg pain.  She was last seen on 05/05/2022 given injection right knee pes anserinus region.  She states she is no longer having pain in the knee.  Over the last month she has developed right lateral thigh anterior thigh pain.  She states she is unable to sleep on her right side.  She denies any numbness tingling down the leg.  Does have periodic back pain.  She states the pain in her anterior lateral aspect of right thigh is worse with walking.  No known injury.  She does not doing any particular exercise at this point in time.  She has had no treatment for this.  Denies any groin pain.  Review of systems: Denies any fevers or chills.  Physical exam: General well-developed well-nourished female no acute distress mood affect appropriate.  Ambulates without any assistive device. Psych: Alert and oriented x3 Bilateral hips: Good range of motion of both hips.  Right hip she has some discomfort with extremes of internal/external rotation.  She has tenderness over the right hip only trochanteric region and down the IT band.  Procedures: Visit Diagnoses:  1. Trochanteric bursitis, right hip     Large Joint Inj on 06/25/2022 9:14 AM Indications: pain Details: 22 G 1.5 in needle, lateral approach  Arthrogram: No  Medications: 3 mL lidocaine 1 %; 40 mg methylPREDNISolone acetate 40 MG/ML Outcome: tolerated well, no immediate complications Procedure, treatment alternatives, risks and benefits explained, specific risks discussed. Consent was given by the patient. Immediately prior to procedure a time out was called to verify the correct patient, procedure, equipment, support staff and site/side marked as required. Patient was prepped and draped in the usual sterile fashion.      Plan: She shown IT band stretching  exercises.  If she develops any radicular symptoms down the leg or if her pain persist she will return.  Otherwise follow-up as needed.  Questions were encouraged and answered

## 2022-07-29 ENCOUNTER — Ambulatory Visit (HOSPITAL_COMMUNITY)
Admission: RE | Admit: 2022-07-29 | Discharge: 2022-07-29 | Disposition: A | Discharge status: Home / Self Care | Payer: PRIVATE HEALTH INSURANCE | Source: Ambulatory Visit | Attending: Surgery | Admitting: Surgery

## 2022-07-29 ENCOUNTER — Ambulatory Visit: Payer: Self-pay

## 2022-07-29 ENCOUNTER — Encounter: Payer: Self-pay | Admitting: Surgery

## 2022-07-29 ENCOUNTER — Ambulatory Visit (INDEPENDENT_AMBULATORY_CARE_PROVIDER_SITE_OTHER): Payer: PRIVATE HEALTH INSURANCE | Admitting: Surgery

## 2022-07-29 VITALS — BP 125/82 | HR 94 | Ht 66.0 in | Wt 150.6 lb

## 2022-07-29 DIAGNOSIS — M79604 Pain in right leg: Secondary | ICD-10-CM | POA: Diagnosis not present

## 2022-07-29 DIAGNOSIS — M79661 Pain in right lower leg: Secondary | ICD-10-CM | POA: Diagnosis not present

## 2022-07-29 DIAGNOSIS — M5416 Radiculopathy, lumbar region: Secondary | ICD-10-CM

## 2022-07-29 MED ORDER — METHYLPREDNISOLONE 4 MG PO TABS: ORAL_TABLET | ORAL | 0 refills | Status: DC

## 2022-07-29 NOTE — Progress Notes (Signed)
Office Visit Note   Patient: Anne Duncan           Date of Birth: [birth year]           MRN: [id number] Visit Date: 07/29/2022              Requested by: Arnette Felts, FNP [address] [place],  [place] [address] PCP: Arnette Felts, FNP   Assessment & Plan: Visit Diagnoses:  1. Pain in right leg   2. Pain of right calf   3. Radiculopathy, lumbar region     Plan: With patient's acute right calf pain I will order venous Doppler to be done today to rule out DVT.  I will await callback report.  Also wonder if patient may be having swelling lumbar radicular symptoms.  Sent in prescription for Medrol Dosepak 6-day taper to be taken as directed.  Follow-Up Instructions: Return in about 2 weeks (around 08/12/2022) for With [name] for recheck.   Orders:  Orders Placed This Encounter  Procedures   XR Lumbar Spine 2-3 Views   VAS [place] LOWER EXTREMITY VENOUS (DVT)   Meds ordered this encounter  Medications   methylPREDNISolone (MEDROL) 4 MG tablet    Sig: 6 DAY TAPER TO BE TAKEN AS DIRECTED    Dispense:  21 tablet    Refill:  0      Procedures: No procedures performed   Clinical Data: No additional findings.   Subjective: Chief Complaint  Patient presents with   Right Leg - Pain    HPI 61 year old female comes in today with complaints of right calf pain and swelling that started yesterday.  Patient denies any recent travel.  States that her right calf feels tight and heavy.  Some numbness in the lower leg.  Denies any back pain.  She was recently seen by Dr. Magnus Ivan June 25, 2022 and had right hip greater trochanter bursa injection and this has done well.  Has been in the right calf when she is ambulating.  No injury. Review of Systems No current complaints of cardiopulmonary GI/GU issues  Objective: Vital Signs: BP 125/82 (BP Location: Left Arm, Patient Position: Sitting, Cuff Size: Normal)   Pulse 94   Ht 5\' 6"  (1.676 m)   Wt 150 lb 9.6 oz (68.3 kg)    BMI 24.31 kg/m   Physical Exam HENT:     Head: Normocephalic and atraumatic.  Eyes:     Extraocular Movements: Extraocular movements intact.  Pulmonary:     Effort: No respiratory distress.  Musculoskeletal:     Comments: Gait is antalgic.  No lumbar paraspinal tenderness.  Positive right-sided notch tenderness.  Nontender over the bilateral hip greater trochanter bursa.  Negative logroll bilaterally.  Negative straight leg raise.  Patient does have some swelling to the right calf.  Right calf is moderate to markedly tender to palpation.  Left calf unremarkable.  Neurologically intact.  Neurological:     Mental Status: She is alert and oriented to person, place, and time.  Psychiatric:        Mood and Affect: Mood normal.     Ortho Exam  Specialty Comments:  No specialty comments available.  Imaging: No results found.   PMFS History: Patient Active Problem List   Diagnosis Date Noted   Vitamin D deficiency 01/23/2021   Costochondritis 08/13/2012   GASTRITIS 09/21/2008   GERD 07/19/2008   SARCOIDOSIS 07/18/2008   DYSPNEA 07/18/2008   Past Medical History:  Diagnosis Date   Dyspnea  GERD (gastroesophageal reflux disease)    Sarcoidosis     Family History  Problem Relation Age of Onset   Asthma Sister    Cancer Mother    Hypertension Brother    Hypertension Sister     Past Surgical History:  Procedure Laterality Date   LAPAROSCOPY     removal bone spurs right shoulder     tubal ligation and reversal     Social History   Occupational History    Comment: Editor, 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

## 2022-08-21 ENCOUNTER — Ambulatory Visit: Payer: No Typology Code available for payment source | Admitting: Physician Assistant

## 2022-09-11 ENCOUNTER — Encounter: Payer: Self-pay | Admitting: Physician Assistant

## 2022-09-11 ENCOUNTER — Ambulatory Visit (INDEPENDENT_AMBULATORY_CARE_PROVIDER_SITE_OTHER): Payer: PRIVATE HEALTH INSURANCE | Admitting: Physician Assistant

## 2022-09-11 DIAGNOSIS — M79604 Pain in right leg: Secondary | ICD-10-CM | POA: Diagnosis not present

## 2022-09-11 NOTE — Progress Notes (Signed)
Office Visit Note   Patient: Anne Duncan           Date of Birth: 26-May-1961           MRN: [id number] Visit Date: 09/11/2022              Requested by: Minette Brine, [place],  [place] [address] PCP: Minette Brine, FNP   Assessment & Plan: Visit Diagnoses:  1. Pain in right leg     Plan: Given patient's pain that is waking her and radicular nature with positive straight leg raise recommend MRI of the lumbar spine to rule out HNP as a source of her radicular symptoms.  Have her follow-up after the MRI to go over results and discuss further treatment.  She will continue to take her ibuprofen for pain relief.  Follow-Up Instructions: Return After MRI.   Orders:  Orders Placed This Encounter  Procedures   MR Lumbar Spine w/o contrast   No orders of the defined types were placed in this encounter.     Procedures: No procedures performed   Clinical Data: No additional findings.   Subjective: Chief Complaint  Patient presents with   Right Hip - Pain    HPI Mrs. Anne Duncan returns today for right leg pain.  She was last seen in November and underwent a Doppler rule out DVT this is negative.  She is also placed on Medrol Dosepak.  She continues to have right lower leg pain with sitting standing.  She notes that she has pain that comes from her hip all the way down to her ankle when lying down.  She states she is really not having the medial knee pain she was having back earlier last fall and that she is not having the pain that she is having on the lateral aspect of the right hip in October.  She does state that the pain down the leg awakens her at night.  She denies any bowel or bladder dysfunction, saddle anesthesia symptoms, fevers or chills.  She has lost about 5 pounds over the last month she states this is due to the pain and this caused her to have decreased appetite.  She has tried Tylenol with no relief.  She does note that ibuprofen has helped.   Also tart cherries have helped with her pain.  Patient's medical history pertinent for sarcoidosis.  She denies any injury to the the right leg. Radiographs lumbar spine dated 07/29/2022 is reviewed: Shows no acute fractures no acute findings disc base overall well-maintained.  No spondylolisthesis.  Review of Systems  Constitutional:  Positive for appetite change and unexpected weight change. Negative for chills and fever.  Musculoskeletal:  Positive for back pain.  Neurological:  Negative for numbness.     Objective: Vital Signs: There were no vitals taken for this visit.  Physical Exam Constitutional:      Appearance: She is not ill-appearing or diaphoretic.  Cardiovascular:     Pulses: Normal pulses.  Pulmonary:     Effort: Pulmonary effort is normal.  Neurological:     Mental Status: She is alert and oriented to person, place, and time.  Psychiatric:        Mood and Affect: Mood normal.     Ortho Exam Lumbar spine tenderness lower lumbar paraspinous region on the left.  No tenderness on the right.  No tenderness over the spinal column.  Full flexion full extension lumbar spine without pain.  Bilateral hips excellent  range of motion without pain.  Slight tenderness over the trochanteric region both hips.  Lower extremities 5 out of 5 strength throughout the lower extremities against resistance positive straight leg raise on the right negative on the left.  Sensation intact bilateral feet.  Tenderness right lower leg medial crossing over the mid shaft of the tibia into the lateral aspect of the proximal tib-fib.  No deformities no bruising about the right lower leg.  Specialty Comments:  No specialty comments available.  Imaging: No results found.   PMFS History: Patient Active Problem List   Diagnosis Date Noted   Vitamin D deficiency 01/23/2021   Costochondritis 08/13/2012   GASTRITIS 09/21/2008   GERD 07/19/2008   SARCOIDOSIS 07/18/2008   DYSPNEA 07/18/2008    Past Medical History:  Diagnosis Date   Dyspnea    GERD (gastroesophageal reflux disease)    Sarcoidosis     Family History  Problem Relation Age of Onset   Asthma Sister    Cancer Mother    Hypertension Brother    Hypertension Sister     Past Surgical History:  Procedure Laterality Date   LAPAROSCOPY     removal bone spurs right shoulder     tubal ligation and reversal     Social History   Occupational History    Comment: Therapist, 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

## 2022-09-28 ENCOUNTER — Other Ambulatory Visit: Payer: No Typology Code available for payment source

## 2022-10-02 ENCOUNTER — Ambulatory Visit
Admission: RE | Admit: 2022-10-02 | Discharge: 2022-10-02 | Disposition: A | Discharge status: Home / Self Care | Payer: No Typology Code available for payment source | Source: Ambulatory Visit | Attending: Physician Assistant | Admitting: Physician Assistant

## 2022-10-02 DIAGNOSIS — M79604 Pain in right leg: Secondary | ICD-10-CM

## 2022-10-06 ENCOUNTER — Telehealth: Payer: Self-pay | Admitting: Orthopaedic Surgery

## 2022-10-06 NOTE — Telephone Encounter (Signed)
Patient states she missed a call to schedule her MRI review for tomorrow with Ninfa Linden. Please advise, not openings till February.Marland Kitchen

## 2022-10-09 ENCOUNTER — Ambulatory Visit (INDEPENDENT_AMBULATORY_CARE_PROVIDER_SITE_OTHER): Payer: PRIVATE HEALTH INSURANCE | Admitting: Orthopaedic Surgery

## 2022-10-09 ENCOUNTER — Encounter: Payer: Self-pay | Admitting: Orthopaedic Surgery

## 2022-10-09 DIAGNOSIS — M5416 Radiculopathy, lumbar region: Secondary | ICD-10-CM | POA: Diagnosis not present

## 2022-10-09 DIAGNOSIS — M79604 Pain in right leg: Secondary | ICD-10-CM | POA: Diagnosis not present

## 2022-10-09 NOTE — Progress Notes (Signed)
The patient is a 62 year old who comes in to go over MRI of her lumbar spine.  She had been having radicular symptoms going down her right leg for some time but now this is finally eased off.  We did send her for an MRI after plain films of her pelvis, lumbar spine and her knee showed no acute findings.  She is young appearing 62 year old as well.  Again she says she is asymptomatic now in terms of her symptoms going down her right leg.  On exam she has excellent muscle tone and strength in her legs and a negative straight leg raise.  The MRI of her lumbar spine shows only just a small disc bulge at L5-S1 but there is no neural compression.  There is no stenosis.  The remainder of her lumbar spine appears normal.  I did give her good news that the architecture of her spine looks great.  She likely had a more significant bulge that is slowly regress with time and now she is asymptomatic.  All questions and concerns were answered and addressed.  Follow-up is as needed.

## 2022-10-16 ENCOUNTER — Ambulatory Visit: Payer: No Typology Code available for payment source | Attending: Internal Medicine | Admitting: Internal Medicine

## 2022-10-29 ENCOUNTER — Encounter: Payer: Self-pay | Admitting: Physician Assistant

## 2022-10-29 ENCOUNTER — Ambulatory Visit (INDEPENDENT_AMBULATORY_CARE_PROVIDER_SITE_OTHER): Payer: No Typology Code available for payment source | Admitting: Physician Assistant

## 2022-10-29 VITALS — Ht 66.0 in | Wt 154.0 lb

## 2022-10-29 DIAGNOSIS — M5416 Radiculopathy, lumbar region: Secondary | ICD-10-CM

## 2022-10-29 MED ORDER — METHOCARBAMOL 500 MG PO TABS: 500.0000 mg | ORAL_TABLET | Freq: Three times a day (TID) | ORAL | 0 refills | Status: DC | PRN

## 2022-10-29 MED ORDER — MELOXICAM 7.5 MG PO TABS: 7.5000 mg | ORAL_TABLET | Freq: Every day | ORAL | 0 refills | Status: DC

## 2022-10-29 NOTE — Progress Notes (Signed)
Office Visit Note   Patient: Anne Duncan           Date of Birth: 03-Jul-1961           MRN: [id number] Visit Date: 10/29/2022              Requested by: Minette Brine, [place],  [place] [address] PCP: Minette Brine, FNP  Chief Complaint  Patient presents with   Lower Back - Pain   Right Leg - Pain      HPI: Patient is a pleasant 62 year old woman who comes in today with right leg pain.  She is a patient of Dr. Trevor Mace.  He had seen her for her lower back and got an MRI.  He did recently review it with her but she had become asymptomatic with her back pain.  She was managing it well with ibuprofen.  She originally had radicular findings going down her right leg to her right foot.  These at all resolved.  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  She said she went and did her 1 day and she thinks is what triggered her back again.  She had to do a lot of heavy lifting and work with patients that she had not done in a while.  Denies any loss of bowel or bladder control but has had the return of the lower leg symptoms.  She had some numbness in her foot.  She describes pain on the anterior aspect of her lower leg denies any calf pain  Assessment & Plan: Visit Diagnoses low back pain with right leg radiculopathy  Plan:  She is just a little bit better than when she originally had reinjured herself.  We recommended switching over to meloxicam from ibuprofen.  She knows not to take both.  Will also call her in a mild muscle relaxant.  Her strength is intact.  She has negative Homans' sign no calf pain.  We also talked about physical therapy.  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  Follow-up if she does not improve  Follow-Up Instructions: No follow-ups on file.   Ortho  Exam  Patient is alert, oriented, no adenopathy, well-dressed, normal affect, normal respiratory effort. Examination of her back no particular tenderness to palpation.  She does have a positive straight leg raise on the right.  She has 5 out of 5 strength with dorsiflexion plantarflexion eversion inversion extension and flexion of her legs.  No hip pain.  Sensation right now is intact  Imaging: No results found. No images are attached to the encounter.  Labs: Lab Results  Component Value Date   HGBA1C 5.5 11/21/2020   ESRSEDRATE 17 08/03/2012     Lab Results  Component Value Date   ALBUMIN 4.7 11/21/2020   ALBUMIN 4.3 08/25/2019   ALBUMIN 4.3 06/17/2018    No results found for: "MG" Lab Results  Component Value Date   VD25OH 27.1 (L) 11/27/2021   VD25OH 100.0 05/28/2021   VD25OH 14.3 (L) 11/21/2020    No results found for: "PREALBUMIN"    Latest Ref Rng & Units 11/27/2021    3:23 PM 11/21/2020    3:40 PM 08/25/2019    9:21 AM  CBC EXTENDED  WBC 3.4 - 10.8 x10E3/uL 5.7  6.9  4.4   RBC 3.77 - 5.28 x10E6/uL 4.12  4.01  4.23   Hemoglobin 11.1 - 15.9 g/dL 11.5  11.5  12.1   HCT 34.0 - 46.6 % 35.8  35.6  38.2   Platelets 150 - 450 x10E3/uL 310  285  264.0   NEUT# 1.4 - 7.7 K/uL   2.0   Lymph# 0.7 - 4.0 K/uL   2.0      Body mass index is 24.86 kg/m.  Orders:  No orders of the defined types were placed in this encounter.  Meds ordered this encounter  Medications   meloxicam (MOBIC) 7.5 MG tablet    Sig: Take 1 tablet (7.5 mg total) by mouth daily.    Dispense:  30 tablet    Refill:  0   methocarbamol (ROBAXIN) 500 MG tablet    Sig: Take 1 tablet (500 mg total) by mouth every 8 (eight) hours as needed for muscle spasms.    Dispense:  30 tablet    Refill:  0     Procedures: No procedures performed  Clinical Data: No additional findings.  ROS:  All other systems negative, except as noted in the HPI. Review of Systems  Objective: Vital Signs: Ht 5' 6"$   (1.676 m)   Wt 154 lb (69.9 kg)   BMI 24.86 kg/m   Specialty Comments:  No specialty comments available.  PMFS History: Patient Active Problem List   Diagnosis Date Noted   Vitamin D deficiency 01/23/2021   Costochondritis 08/13/2012   GASTRITIS 09/21/2008   GERD 07/19/2008   SARCOIDOSIS 07/18/2008   DYSPNEA 07/18/2008   Past Medical History:  Diagnosis Date   Dyspnea    GERD (gastroesophageal reflux disease)    Sarcoidosis     Family History  Problem Relation Age of Onset   Asthma Sister    Cancer Mother    Hypertension Brother    Hypertension Sister     Past Surgical History:  Procedure Laterality Date   LAPAROSCOPY     removal bone spurs right shoulder     tubal ligation and reversal     Social History   Occupational History    Comment: Therapist, 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

## 2022-11-13 ENCOUNTER — Encounter: Payer: Self-pay | Admitting: Radiology

## 2022-11-26 ENCOUNTER — Other Ambulatory Visit: Payer: Self-pay | Admitting: Physician Assistant

## 2022-12-01 ENCOUNTER — Other Ambulatory Visit: Payer: Self-pay

## 2022-12-01 ENCOUNTER — Encounter (HOSPITAL_COMMUNITY): Payer: Self-pay | Admitting: *Deleted

## 2022-12-01 ENCOUNTER — Ambulatory Visit (HOSPITAL_COMMUNITY)
Admission: EM | Admit: 2022-12-01 | Discharge: 2022-12-01 | Disposition: A | Discharge status: Home / Self Care | Payer: PRIVATE HEALTH INSURANCE | Attending: Internal Medicine | Admitting: Internal Medicine

## 2022-12-01 DIAGNOSIS — R112 Nausea with vomiting, unspecified: Secondary | ICD-10-CM | POA: Diagnosis not present

## 2022-12-01 DIAGNOSIS — R197 Diarrhea, unspecified: Secondary | ICD-10-CM | POA: Diagnosis not present

## 2022-12-01 DIAGNOSIS — B349 Viral infection, unspecified: Secondary | ICD-10-CM

## 2022-12-01 LAB — POC INFLUENZA A AND B ANTIGEN (URGENT CARE ONLY)
INFLUENZA A ANTIGEN, POC: NEGATIVE
INFLUENZA B ANTIGEN, POC: NEGATIVE

## 2022-12-01 MED ORDER — ONDANSETRON 4 MG PO TBDP: 4.0000 mg | ORAL_TABLET | Freq: Three times a day (TID) | ORAL | 0 refills | Status: DC | PRN

## 2022-12-01 MED ORDER — ONDANSETRON 4 MG PO TBDP
4.0000 mg | ORAL_TABLET | Freq: Once | ORAL | Status: AC
  Administered 2022-12-01: 4 mg via ORAL

## 2022-12-01 MED ORDER — ONDANSETRON 4 MG PO TBDP
ORAL_TABLET | ORAL | Status: AC
  Filled 2022-12-01: qty 1

## 2022-12-01 MED ORDER — BENZONATATE 100 MG PO CAPS: 100.0000 mg | ORAL_CAPSULE | Freq: Three times a day (TID) | ORAL | 0 refills | Status: DC

## 2022-12-01 NOTE — Discharge Instructions (Addendum)
Your evaluation suggests that your symptoms are most likely due to viral stomach illness (gastroenteritis) which will improve on its own with rest and fluids in the next few days.   Take tessalon perles as needed for cough every 8 hours.  I have prescribed an antinausea medication for you to take at home called Zofran. It is the same medication that we gave you in the office.  You may use tylenol 1,000mg  every 6 hours over the counter as needed for abdominal discomfort related to this virus.   Eat a bland diet for the next 12-24 hours (bananas, rice, white toast, and applesauce) once you are able to tolerate liquids (broth, etc). These foods are easy for your stomach to digest. Pedialyte can be purchased to help with rehydration. Drink plenty of water.   Please follow up with your primary care provider for further management. Return if you experience worsening or uncontrolled pain, inability to tolerate fluids by mouth, difficulty breathing, fevers 100.53F or greater, recurrent vomiting, or any other concerning symptoms. I hope you feel better!

## 2022-12-01 NOTE — ED Triage Notes (Signed)
PT reports starting Sat she had N/V/D. Pt also has had Muscle aches.HA, and SHOB.. Pt reports Diarrhea  and vomiting has stopped but still has SHOB,CHILLS and Muscle pain.and fever.

## 2022-12-01 NOTE — ED Provider Notes (Signed)
[place]    CSN: [id number] Arrival date & time: 12/01/22  0805      History   Chief Complaint Chief Complaint  Patient presents with   Emesis   Diarrhea   Chills   Muscle Pain   Shortness of Breath    HPI Anne Duncan is a 62 y.o. female.   Patient presents to urgent care for evaluation of nausea, vomiting, diarrhea, generalized abdominal discomfort, generalized body aches, cough, and fever/chills that started abruptly less than 48 hours ago on November 29, 2022 in the evening.  States symptoms began with nausea and vomiting.  Has had multiple episodes of nonbloody/nonbilious emesis over the last 24 hours, symptoms seem to have improved this morning.  Fever was 101 initially but responded well to use of Tylenol.  Reports generalized abdominal pain associated with nausea, vomiting, and diarrhea.  Abdominal discomfort is currently mild.  No blood/mucus in the stools.  States she began to experience dry cough last night.  Denies chest pain, shortness of breath, dizziness, heart palpitations, history of abdominal surgeries, sore throat, ear pain, headache, vision changes, and lightheadedness.  She has been able to tolerate chicken broth and water without vomiting since last episode of emesis yesterday.  She states she was recently around her sister who just got over influenza.  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 Denies history of chronic respiratory problems.  Former cigarette smoker, denies drug use.  She has been using OTC medications for symptoms without much relief.   Emesis Associated symptoms: diarrhea   Diarrhea Associated symptoms: vomiting   Muscle Pain Associated symptoms include shortness of breath.  Shortness of Breath Associated symptoms: vomiting     Past Medical History:  Diagnosis Date   Dyspnea    GERD (gastroesophageal reflux  disease)    Sarcoidosis     Patient Active Problem List   Diagnosis Date Noted   Vitamin D deficiency 01/23/2021   Costochondritis 08/13/2012   GASTRITIS 09/21/2008   GERD 07/19/2008   SARCOIDOSIS 07/18/2008   DYSPNEA 07/18/2008    Past Surgical History:  Procedure Laterality Date   LAPAROSCOPY     removal bone spurs right shoulder     tubal ligation and reversal      OB History   No obstetric history on file.      Home Medications    Prior to Admission medications   Medication Sig Start Date End Date Taking? Authorizing Provider  benzonatate (TESSALON) 100 MG capsule Take 1 capsule (100 mg total) by mouth every 8 (eight) hours. 12/01/22  Yes Talbot Grumbling, FNP  ondansetron (ZOFRAN-ODT) 4 MG disintegrating tablet Take 1 tablet (4 mg total) by mouth every 8 (eight) hours as needed for nausea or vomiting. 12/01/22  Yes Talbot Grumbling, FNP  Cyanocobalamin (VITAMIN B 12 PO) Take 1 tablet by mouth daily in the afternoon.    [provider]  diclofenac (VOLTAREN) 75 MG EC tablet Take 1 tablet (75 mg total) by mouth 2 (two) times daily as needed. 05/05/22   Mcarthur Rossetti, MD  meloxicam (MOBIC) 7.5 MG tablet TAKE 1 TABLET(7.5 MG) BY MOUTH DAILY 11/26/22   Persons, Bevely Palmer, PA  methocarbamol (ROBAXIN) 500 MG tablet Take 1 tablet (500 mg total) by mouth every 8 (eight) hours as needed for muscle spasms. 10/29/22   Persons, Bevely Palmer, [place]  VITAMIN D PO  Take 3,000 Units by mouth daily in the afternoon.    [provider]    Family History Family History  Problem Relation Age of Onset   Asthma Sister    Cancer Mother    Hypertension Brother    Hypertension Sister     Social History Social History   Tobacco Use   Smoking status: Former    Packs/day: 0.10    Years: 12.00    Additional pack years: 0.00    Total pack years: 1.20    Types: Cigarettes    Quit date: 07/22/1995    Years since quitting: 27.3   Smokeless tobacco: Never   Vaping Use   Vaping Use: Never used  Substance Use Topics   Alcohol use: No   Drug use: No     Allergies   Penicillins   Review of Systems Review of Systems  Respiratory:  Positive for shortness of breath.   Gastrointestinal:  Positive for diarrhea and vomiting.  Per HPI   Physical Exam Triage Vital Signs ED Triage Vitals  Enc Vitals Group     BP 12/01/22 0835 128/83     Pulse Rate 12/01/22 0835 72     Resp 12/01/22 0835 18     Temp 12/01/22 0835 98.2 F (36.8 C)     Temp src --      SpO2 12/01/22 0835 95 %     Weight --      Height --      Head Circumference --      Peak Flow --      Pain Score 12/01/22 0833 6     Pain Loc --      Pain Edu? --      Excl. in [place]? --    No data found.  Updated Vital Signs BP 128/83   Pulse 72   Temp 98.2 F (36.8 C)   Resp 18   SpO2 95%   Visual Acuity Right Eye Distance:   Left Eye Distance:   Bilateral Distance:    Right Eye Near:   Left Eye Near:    Bilateral Near:     Physical Exam Vitals and nursing note reviewed.  Constitutional:      Appearance: She is ill-appearing. She is not toxic-appearing.  HENT:     Head: Normocephalic and atraumatic.     Right Ear: Hearing, tympanic membrane, ear canal and external ear normal.     Left Ear: Hearing, tympanic membrane, ear canal and external ear normal.     Nose: Nose normal.     Mouth/Throat:     Lips: Pink.     Mouth: Mucous membranes are moist. No injury.     Tongue: No lesions. Tongue does not deviate from midline.     Palate: No mass and lesions.     Pharynx: Oropharynx is clear. Uvula midline. No pharyngeal swelling, oropharyngeal exudate, posterior oropharyngeal erythema or uvula swelling.     Tonsils: No tonsillar exudate or tonsillar abscesses.  Eyes:     General: Lids are normal. Vision grossly intact. Gaze aligned appropriately.     Extraocular Movements: Extraocular movements intact.     Conjunctiva/sclera: Conjunctivae normal.     Pupils: Pupils  are equal, round, and reactive to light.  Cardiovascular:     Rate and Rhythm: Normal rate and regular rhythm.     Heart sounds: Normal heart sounds, S1 normal and S2 normal.  Pulmonary:     Effort: Pulmonary effort is normal. No respiratory  distress.     Breath sounds: Normal breath sounds and air entry. No decreased breath sounds, wheezing, rhonchi or rales.  Abdominal:     General: Abdomen is flat. Bowel sounds are normal.     Palpations: Abdomen is soft.     Tenderness: There is generalized abdominal tenderness. There is no right CVA tenderness, left CVA tenderness or guarding.     Comments: No peritoneal signs to abdominal exam.  Musculoskeletal:     Cervical back: Neck supple.  Skin:    General: Skin is warm and dry.     Capillary Refill: Capillary refill takes less than 2 seconds.     Findings: No rash.  Neurological:     General: No focal deficit present.     Mental Status: She is alert and oriented to person, place, and time. Mental status is at baseline.     Cranial Nerves: No dysarthria or facial asymmetry.  Psychiatric:        Mood and Affect: Mood normal.        Speech: Speech normal.        Behavior: Behavior normal.        Thought Content: Thought content normal.        Judgment: Judgment normal.      UC Treatments / Results  Labs (all labs ordered are listed, but only abnormal results are displayed) Labs Reviewed  POC INFLUENZA A AND B ANTIGEN (URGENT CARE ONLY)    EKG   Radiology No results found.  Procedures Procedures (including critical care time)  Medications Ordered in UC Medications  ondansetron (ZOFRAN-ODT) disintegrating tablet 4 mg (4 mg Oral Given 12/01/22 0902)    Initial Impression / Assessment and Plan / UC Course  I have reviewed the triage vital signs and the nursing notes.  Pertinent labs & imaging results that were available during my care of the patient were reviewed by me and considered in my medical decision making (see chart  for details).   1. Viral syndrome, N/V/D Presentation is consistent with acute viral gastroenteritis that will likely improve with rest, increased fluid intake, and as needed use of antiemetics.  Influenza testing is negative in clinic.  Low suspicion for COVID-19, therefore deferred testing.  Abdominal exam is without peritoneal signs, patient is nontoxic in appearance, and vitals are hemodynamically stable. Push fluids with water and pedialyte for rehydration. Zofran 4mg  ODT given in clinic for nausea. May use zofran 4mg  ODT every 8 hours as needed for nausea and vomiting. May use tylenol for abdominal discomfort at home related to viral illness. Bland/liquid diet recommended for 12-24 hours, then increase diet to normal as tolerated.  Tessalon Perles sent to pharmacy to use as needed for cough.  Deferred imaging based on stable cardiopulmonary exam and hemodynamically stable vital signs in clinic.  Patient is agreeable with plan.  Discussed physical exam and available lab work findings in clinic with patient.  Counseled patient regarding appropriate use of medications and potential side effects for all medications recommended or prescribed today. Discussed red flag signs and symptoms of worsening condition,when to call the PCP office, return to urgent care, and when to seek higher level of care in the emergency department. Patient verbalizes understanding and agreement with plan. All questions answered. Patient discharged in stable condition.    Final Clinical Impressions(s) / UC Diagnoses   Final diagnoses:  Nausea vomiting and diarrhea  Viral syndrome     Discharge Instructions      Your evaluation suggests  that your symptoms are most likely due to viral stomach illness (gastroenteritis) which will improve on its own with rest and fluids in the next few days.   Take tessalon perles as needed for cough every 8 hours.  I have prescribed an antinausea medication for you to take at home  called Zofran. It is the same medication that we gave you in the office.  You may use tylenol 1,000mg  every 6 hours over the counter as needed for abdominal discomfort related to this virus.   Eat a bland diet for the next 12-24 hours (bananas, rice, white toast, and applesauce) once you are able to tolerate liquids (broth, etc). These foods are easy for your stomach to digest. Pedialyte can be purchased to help with rehydration. Drink plenty of water.   Please follow up with your primary care provider for further management. Return if you experience worsening or uncontrolled pain, inability to tolerate fluids by mouth, difficulty breathing, fevers 100.48F or greater, recurrent vomiting, or any other concerning symptoms. I hope you feel better!      ED Prescriptions     Medication Sig Dispense Auth. Provider   ondansetron (ZOFRAN-ODT) 4 MG disintegrating tablet Take 1 tablet (4 mg total) by mouth every 8 (eight) hours as needed for nausea or vomiting. 20 tablet Joella Prince M, FNP   benzonatate (TESSALON) 100 MG capsule Take 1 capsule (100 mg total) by mouth every 8 (eight) hours. 21 capsule Talbot Grumbling, FNP      PDMP not reviewed this encounter.   Talbot Grumbling, [place] 12/01/22 0930

## 2022-12-29 ENCOUNTER — Other Ambulatory Visit: Payer: Self-pay | Admitting: Physician Assistant

## 2023-01-05 ENCOUNTER — Telehealth: Payer: Self-pay | Admitting: Orthopaedic Surgery

## 2023-01-08 ENCOUNTER — Other Ambulatory Visit: Payer: Self-pay

## 2023-01-08 ENCOUNTER — Ambulatory Visit (INDEPENDENT_AMBULATORY_CARE_PROVIDER_SITE_OTHER): Payer: PRIVATE HEALTH INSURANCE | Admitting: Orthopaedic Surgery

## 2023-01-08 DIAGNOSIS — M5416 Radiculopathy, lumbar region: Secondary | ICD-10-CM

## 2023-01-08 DIAGNOSIS — M79604 Pain in right leg: Secondary | ICD-10-CM

## 2023-01-08 NOTE — Progress Notes (Signed)
The patient is a 62 year old female that we saw earlier this year due to low back pain and right-sided radicular symptoms.  A MRI was obtained of her lumbar spine in January after the failure of conservative care.  The MRI showed only a small central protrusion at L5-S1 of the disc but no apparent contact with any of the nerves.  She tried anti-inflammatories and had done well.  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  On my exam today her right hip and her right knee exam are normal as well as her foot and ankle exam.  However she is having some weakness with foot dorsiflexion on the right side and some numbness.  She is showing some irritation with a straight leg raise on the right side.  At this point she is back on meloxicam and Robaxin.  I am going to restrict her at work in terms of lifting no greater than 25 pounds.  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  At this point I would like to send her to Dr. Alvester Morin to consider a right-sided epidural steroid injection at L5-S1 which seems to correlate with her symptoms.  Once she sees Dr. Alvester Morin, he can get her back to our section and 2 to 3 weeks after that.  All questions and concerns were answered and addressed.  She agrees with this treatment plan.

## 2023-01-12 ENCOUNTER — Ambulatory Visit: Payer: PRIVATE HEALTH INSURANCE | Admitting: Nurse Practitioner

## 2023-01-15 ENCOUNTER — Ambulatory Visit: Payer: PRIVATE HEALTH INSURANCE | Admitting: Nurse Practitioner

## 2023-01-22 ENCOUNTER — Ambulatory Visit (INDEPENDENT_AMBULATORY_CARE_PROVIDER_SITE_OTHER): Payer: PRIVATE HEALTH INSURANCE | Admitting: Physical Medicine and Rehabilitation

## 2023-01-22 ENCOUNTER — Other Ambulatory Visit: Payer: Self-pay

## 2023-01-22 VITALS — BP 125/77 | HR 79

## 2023-01-22 DIAGNOSIS — M5416 Radiculopathy, lumbar region: Secondary | ICD-10-CM | POA: Diagnosis not present

## 2023-01-22 MED ORDER — METHYLPREDNISOLONE ACETATE 80 MG/ML IJ SUSP
80.0000 mg | Freq: Once | INTRAMUSCULAR | Status: AC
  Administered 2023-01-22: 80 mg

## 2023-01-22 NOTE — Patient Instructions (Signed)

## 2023-01-22 NOTE — Progress Notes (Signed)
Functional Pain Scale - descriptive words and definitions  Distracting (5)    Aware of pain/able to complete some ADL's but limited by pain/sleep is affected and active distractions are only slightly useful. Moderate range order  Average Pain  varies   +Driver, -BT, -Dye Allergies.  Lower back pain on right side that radiates into right leg

## 2023-02-04 NOTE — Procedures (Signed)
Lumbar Epidural Steroid Injection - Interlaminar Approach with Fluoroscopic Guidance  Patient: Anne Duncan      Date of Birth: 08/23/1961 MRN: [id number] PCP: Arnette Felts, FNP      Visit Date: 01/22/2023   Universal Protocol:     Consent Given By: the patient  Position: PRONE  Additional Comments: Vital signs were monitored before and after the procedure. Patient was prepped and draped in the usual sterile fashion. The correct patient, procedure, and site was verified.   Injection Procedure Details:   Procedure diagnoses: Lumbar radiculopathy [M54.16]   Meds Administered:  Meds ordered this encounter  Medications   methylPREDNISolone acetate (DEPO-MEDROL) injection 80 mg     Laterality: Right  Location/Site:  L5-S1  Needle: 3.5 in., 20 ga. Tuohy  Needle Placement: Paramedian epidural  Findings:   -Comments: Excellent flow of contrast into the epidural space.  Procedure Details: Using a paramedian approach from the side mentioned above, the region overlying the inferior lamina was localized under fluoroscopic visualization and the soft tissues overlying this structure were infiltrated with 4 ml. of 1% Lidocaine without Epinephrine. The Tuohy needle was inserted into the epidural space using a paramedian approach.   The epidural space was localized using loss of resistance along with counter oblique bi-planar fluoroscopic views.  After negative aspirate for air, blood, and CSF, a 2 ml. volume of Isovue-250 was injected into the epidural space and the flow of contrast was observed. Radiographs were obtained for documentation purposes.    The injectate was administered into the level noted above.   Additional Comments:  The patient tolerated the procedure well Dressing: 2 x 2 sterile gauze and Band-Aid    Post-procedure details: Patient was observed during the procedure. Post-procedure instructions were reviewed.  Patient left the clinic in stable condition.

## 2023-02-04 NOTE — Progress Notes (Signed)
Anne Duncan - 62 y.o. female MRN [id number]  Date of birth: September 29, 1960  Office Visit Note: Visit Date: 01/22/2023 PCP: Arnette Felts, FNP Referred by: Arnette Felts, FNP  Subjective: Chief Complaint  Patient presents with   Lower Back - Pain   HPI:  Anne Duncan is a 62 y.o. female who comes in today at the request of Dr. Doneen Poisson for planned Right L5-S1 Lumbar Interlaminar epidural steroid injection with fluoroscopic guidance.  The patient has failed conservative care including home exercise, medications, time and activity modification.  This injection will be diagnostic and hopefully therapeutic.  Please see requesting physician notes for further details and justification.   ROS Otherwise per HPI.  Assessment & Plan: Visit Diagnoses:    ICD-10-CM   1. Lumbar radiculopathy  M54.16 XR C-ARM NO REPORT    Epidural Steroid injection    methylPREDNISolone acetate (DEPO-MEDROL) injection 80 mg      Plan: No additional findings.   Meds & Orders:  Meds ordered this encounter  Medications   methylPREDNISolone acetate (DEPO-MEDROL) injection 80 mg    Orders Placed This Encounter  Procedures   XR C-ARM NO REPORT   Epidural Steroid injection    Follow-up: Return for visit to requesting provider as needed.   Procedures: No procedures performed  Lumbar Epidural Steroid Injection - Interlaminar Approach with Fluoroscopic Guidance  Patient: Anne Duncan      Date of Birth: 1961-02-26 MRN: [id number] PCP: Arnette Felts, FNP      Visit Date: 01/22/2023   Universal Protocol:     Consent Given By: the patient  Position: PRONE  Additional Comments: Vital signs were monitored before and after the procedure. Patient was prepped and draped in the usual sterile fashion. The correct patient, procedure, and site was verified.   Injection Procedure Details:   Procedure diagnoses: Lumbar radiculopathy [M54.16]   Meds Administered:  Meds ordered this encounter   Medications   methylPREDNISolone acetate (DEPO-MEDROL) injection 80 mg     Laterality: Right  Location/Site:  L5-S1  Needle: 3.5 in., 20 ga. Tuohy  Needle Placement: Paramedian epidural  Findings:   -Comments: Excellent flow of contrast into the epidural space.  Procedure Details: Using a paramedian approach from the side mentioned above, the region overlying the inferior lamina was localized under fluoroscopic visualization and the soft tissues overlying this structure were infiltrated with 4 ml. of 1% Lidocaine without Epinephrine. The Tuohy needle was inserted into the epidural space using a paramedian approach.   The epidural space was localized using loss of resistance along with counter oblique bi-planar fluoroscopic views.  After negative aspirate for air, blood, and CSF, a 2 ml. volume of Isovue-250 was injected into the epidural space and the flow of contrast was observed. Radiographs were obtained for documentation purposes.    The injectate was administered into the level noted above.   Additional Comments:  The patient tolerated the procedure well Dressing: 2 x 2 sterile gauze and Band-Aid    Post-procedure details: Patient was observed during the procedure. Post-procedure instructions were reviewed.  Patient left the clinic in stable condition.   Clinical History: MRI LUMBAR SPINE WITHOUT CONTRAST   TECHNIQUE: Multiplanar, multisequence MR imaging of the lumbar spine was performed. No intravenous contrast was administered.   COMPARISON:  None Available.   FINDINGS: Segmentation:  Standard.   Alignment:  Physiologic.   Vertebrae: No acute fracture, evidence of discitis, or aggressive bone lesion.   Conus medullaris and cauda equina: Conus extends to the  L1 level. Conus and cauda equina appear normal.   Paraspinal and other soft tissues: No acute paraspinal abnormality.   Disc levels:   Disc spaces: Disc desiccation with minimal disc height loss  at L5-S1.   T12-L1: No significant disc bulge. No neural foraminal stenosis. No central canal stenosis.   L1-L2: No significant disc bulge. No neural foraminal stenosis. No central canal stenosis.   L2-L3: No significant disc bulge. No neural foraminal stenosis. No central canal stenosis.   L3-L4: No significant disc bulge. No neural foraminal stenosis. No central canal stenosis.   L4-L5: No significant disc bulge. No neural foraminal stenosis. No central canal stenosis.   L5-S1: Small central disc protrusion. No neural foraminal stenosis. No central canal stenosis.   IMPRESSION: 1. At L5-S1 there is a small central disc protrusion. No evidence of nerve root impingement. 2. No acute osseous injury of the lumbar spine.     Electronically Signed   By: Elige Ko M.D.   On: 10/03/2022 11:25     Objective:  VS:  HT:    WT:   BMI:     BP:125/77  HR:79bpm  TEMP: ( )  RESP:  Physical Exam Vitals and nursing note reviewed.  Constitutional:      General: She is not in acute distress.    Appearance: Normal appearance. She is not ill-appearing.  HENT:     Head: Normocephalic and atraumatic.     Right Ear: External ear normal.     Left Ear: External ear normal.  Eyes:     Extraocular Movements: Extraocular movements intact.  Cardiovascular:     Rate and Rhythm: Normal rate.     Pulses: Normal pulses.  Pulmonary:     Effort: Pulmonary effort is normal. No respiratory distress.  Abdominal:     General: There is no distension.     Palpations: Abdomen is soft.  Musculoskeletal:        General: Tenderness present.     Cervical back: Neck supple.     Right lower leg: No edema.     Left lower leg: No edema.     Comments: Patient has good distal strength with no pain over the greater trochanters.  No clonus or focal weakness.  Skin:    Findings: No erythema, lesion or rash.  Neurological:     General: No focal deficit present.     Mental Status: She is alert and  oriented to person, place, and time.     Sensory: No sensory deficit.     Motor: No weakness or abnormal muscle tone.     Coordination: Coordination normal.  Psychiatric:        Mood and Affect: Mood normal.        Behavior: Behavior normal.      Imaging: No results found.

## 2023-02-13 ENCOUNTER — Telehealth: Payer: Self-pay | Admitting: Nurse Practitioner

## 2023-02-13 NOTE — Telephone Encounter (Signed)
Called pt to schedule appt due to her not being seen since 11/27/21 no answer left VM

## 2023-03-27 ENCOUNTER — Encounter (HOSPITAL_COMMUNITY): Payer: Self-pay

## 2023-03-27 ENCOUNTER — Ambulatory Visit (HOSPITAL_COMMUNITY)
Admission: EM | Admit: 2023-03-27 | Discharge: 2023-03-27 | Disposition: A | Discharge status: Home / Self Care | Payer: PRIVATE HEALTH INSURANCE | Source: Home / Self Care

## 2023-03-27 DIAGNOSIS — M62838 Other muscle spasm: Secondary | ICD-10-CM | POA: Diagnosis not present

## 2023-03-27 DIAGNOSIS — M62830 Muscle spasm of back: Secondary | ICD-10-CM | POA: Diagnosis not present

## 2023-03-27 DIAGNOSIS — R0789 Other chest pain: Secondary | ICD-10-CM

## 2023-03-27 MED ORDER — NAPROXEN 500 MG PO TABS: 500.0000 mg | ORAL_TABLET | Freq: Two times a day (BID) | ORAL | 0 refills | Status: DC

## 2023-03-27 MED ORDER — METHOCARBAMOL 500 MG PO TABS: 500.0000 mg | ORAL_TABLET | Freq: Two times a day (BID) | ORAL | 0 refills | Status: DC

## 2023-03-27 NOTE — ED Triage Notes (Signed)
Patient c/o upper back pain and chest pain. Patient states the pain happens only when she takes a deep breath.  Patient denies taking anything for her pain.

## 2023-03-27 NOTE — ED Provider Notes (Signed)
[facility]    CSN: [id number] Arrival date & time: 03/27/23  0807      History   Chief Complaint Chief Complaint  Patient presents with   Back Pain    HPI Anne Duncan is a 62 y.o. female.   Patient presents to clinic for upper back pain that started this morning with waking. Reports a muscle spasm. She also has central chest pain with deep inspiration that is reproducible to palpation. She denies cough or SOB. Denies heavy lifting, trauma or falls. Has not taken anything for pain. Pain exacerbates with movement. Reports her neck is stiff as well.    Hx of lumbar interlaminar epidural steroid injection with fluoroscopic guidance on 01/22/23.   The history is provided by the patient and medical records.    Past Medical History:  Diagnosis Date   Dyspnea    GERD (gastroesophageal reflux disease)    Sarcoidosis     Patient Active Problem List   Diagnosis Date Noted   Vitamin D deficiency 01/23/2021   Costochondritis 08/13/2012   GASTRITIS 09/21/2008   GERD 07/19/2008   SARCOIDOSIS 07/18/2008   DYSPNEA 07/18/2008    Past Surgical History:  Procedure Laterality Date   LAPAROSCOPY     removal bone spurs right shoulder     tubal ligation and reversal      OB History   No obstetric history on file.      Home Medications    Prior to Admission medications   Medication Sig Start Date End Date Taking? Authorizing Provider  methocarbamol (ROBAXIN) 500 MG tablet Take 1 tablet (500 mg total) by mouth 2 (two) times daily. 03/27/23  Yes Rinaldo Ratel, [place] N, FNP  naproxen (NAPROSYN) 500 MG tablet Take 1 tablet (500 mg total) by mouth 2 (two) times daily. 03/27/23  Yes Rinaldo Ratel, [place] N, FNP  benzonatate (TESSALON) 100 MG capsule Take 1 capsule (100 mg total) by mouth every 8 (eight) hours. Patient not taking: Reported on 01/22/2023 12/01/22   Carlisle Beers, FNP  Cyanocobalamin (VITAMIN B 12 PO) Take 1 tablet by mouth daily in the afternoon.    [provider]  ondansetron (ZOFRAN-ODT) 4 MG disintegrating tablet Take 1 tablet (4 mg total) by mouth every 8 (eight) hours as needed for nausea or vomiting. 12/01/22   Carlisle Beers, FNP  VITAMIN D PO Take 3,000 Units by mouth daily in the afternoon.    [provider]    Family History Family History  Problem Relation Age of Onset   Asthma Sister    Cancer Mother    Hypertension Brother    Hypertension Sister     Social History Social History   Tobacco Use   Smoking status: Former    Current packs/day: 0.00    Average packs/day: 0.1 packs/day for 12.0 years (1.2 ttl pk-yrs)    Types: Cigarettes    Start date: 07/22/1983    Quit date: 07/22/1995    Years since quitting: 27.6   Smokeless tobacco: Never  Vaping Use   Vaping status: Never Used  Substance Use Topics   Alcohol use: No   Drug use: No     Allergies   Penicillins   Review of Systems Review of Systems  Constitutional:  Negative for fever.  Musculoskeletal:  Positive for neck pain and neck stiffness.  Neurological:  Negative for weakness.     Physical Exam Triage Vital Signs ED Triage Vitals  Encounter Vitals Group     BP  Systolic BP Percentile      Diastolic BP Percentile      Pulse      Resp      Temp      Temp src      SpO2      Weight      Height      Head Circumference      Peak Flow      Pain Score      Pain Loc      Pain Education      Exclude from Growth Chart    No data found.  Updated Vital Signs BP 114/78 (BP Location: Left Arm)   Pulse 77   Temp 98.3 F (36.8 C) (Oral)   Resp 14   SpO2 99%   Visual Acuity Right Eye Distance:   Left Eye Distance:   Bilateral Distance:    Right Eye Near:   Left Eye Near:    Bilateral Near:     Physical Exam Vitals and nursing note reviewed.  Constitutional:      Appearance: Normal appearance.  HENT:     Head: Normocephalic and atraumatic.     Right Ear: External ear normal.     Left Ear: External ear  normal.     Nose: Nose normal.     Mouth/Throat:     Mouth: Mucous membranes are moist.  Eyes:     Conjunctiva/sclera: Conjunctivae normal.  Cardiovascular:     Rate and Rhythm: Normal rate and regular rhythm.     Heart sounds: Normal heart sounds. No murmur heard. Pulmonary:     Effort: Pulmonary effort is normal. No respiratory distress.     Breath sounds: Normal breath sounds.  Chest:     Chest wall: Tenderness present. No deformity, crepitus or edema.    Musculoskeletal:        General: Tenderness present. Normal range of motion.     Cervical back: Normal range of motion. Tenderness present. Pain with movement and muscular tenderness present.  Skin:    General: Skin is warm and dry.  Neurological:     General: No focal deficit present.     Mental Status: She is alert and oriented to person, place, and time.  Psychiatric:        Mood and Affect: Mood normal.        Behavior: Behavior normal. Behavior is cooperative.      UC Treatments / Results  Labs (all labs ordered are listed, but only abnormal results are displayed) Labs Reviewed - No data to display  EKG   Radiology No results found.  Procedures Procedures (including critical care time)  Medications Ordered in UC Medications - No data to display  Initial Impression / Assessment and Plan / UC Course  I have reviewed the triage vital signs and the nursing notes.  Pertinent labs & imaging results that were available during my care of the patient were reviewed by me and considered in my medical decision making (see chart for details).  Vitals and triage reviewed, patient is hemodynamically stable. Has bilateral trapezius TTP and central chest is also TTP. Low risk for CV event, low suspicion d/t reproducibility w/ palpation. Likely MS in nature. Will trial NSAIDs and muscle relaxer. POC, f/u care and return precautions given, no questions at this time.      Final Clinical Impressions(s) / UC Diagnoses    Final diagnoses:  Spasm of both trapezius muscles  Muscle spasm  Chest wall pain  Discharge Instructions      Your symptoms are consistent with a muscle spasm. Please rest, use heat, gentle stretching, anti-inflammatories and the muscle relaxer as needed. This medication may make you drowsy, do not drink or drive on this.   If this continues, please follow-up with orthopedics. Return to clinic for any new concerning symptoms.      ED Prescriptions     Medication Sig Dispense Auth. Provider   methocarbamol (ROBAXIN) 500 MG tablet Take 1 tablet (500 mg total) by mouth 2 (two) times daily. 20 tablet Rinaldo Ratel, [place] N, [place]   naproxen (NAPROSYN) 500 MG tablet Take 1 tablet (500 mg total) by mouth 2 (two) times daily. 30 tablet Janesa Dockery, [place] N, [place]      PDMP not reviewed this encounter.   Rinaldo Ratel [place] N, [place] 03/27/23 [phone]

## 2023-03-27 NOTE — Discharge Instructions (Addendum)
Your symptoms are consistent with a muscle spasm. Please rest, use heat, gentle stretching, anti-inflammatories and the muscle relaxer as needed. This medication may make you drowsy, do not drink or drive on this.   If this continues, please follow-up with orthopedics. Return to clinic for any new concerning symptoms.

## 2024-05-11 ENCOUNTER — Other Ambulatory Visit: Payer: Self-pay

## 2024-05-11 ENCOUNTER — Encounter: Payer: Self-pay | Admitting: Physician Assistant

## 2024-05-11 ENCOUNTER — Ambulatory Visit (INDEPENDENT_AMBULATORY_CARE_PROVIDER_SITE_OTHER): Payer: PRIVATE HEALTH INSURANCE | Admitting: Physician Assistant

## 2024-05-11 DIAGNOSIS — M25511 Pain in right shoulder: Secondary | ICD-10-CM | POA: Diagnosis not present

## 2024-05-11 DIAGNOSIS — G8929 Other chronic pain: Secondary | ICD-10-CM | POA: Diagnosis not present

## 2024-05-11 NOTE — Progress Notes (Signed)
 Office Visit Note   Patient: Anne Duncan           Date of Birth: [birth year]           MRN: [id number] Visit Date: 05/11/2024              Requested by: Georgina Speaks, FNP [id number] [place],  [place] [address] PCP: Georgina Speaks, FNP   Assessment & Plan: Visit Diagnoses:  1. Chronic right shoulder pain     Plan: Patient is a pleasant 63 year old woman has been having right shoulder pain for a couple weeks does radiate up her neck but no paresthesias and her strength is good.  I think most likely she has some rotator cuff tendinitis.  She does have a little degenerative changes of the [facility] joint and also a cystic change in the humeral head.  Exam today is consistent with impingement syndrome.  I talked about the natural history of this she is doing a little better taking 800 mg of ibuprofen I said she can continue to do this.  She works at a rehab facility and she said they can give her some exercises to do which should also like to do if she does not get better in 3 weeks would recommend an injection  Follow-Up Instructions: No follow-ups on file.   Orders:  Orders Placed This Encounter  Procedures   XR Shoulder Right   No orders of the defined types were placed in this encounter.     Procedures: No procedures performed   Clinical Data: No additional findings.   Subjective: No chief complaint on file.   HPI patient is a pleasant 63 year old woman who comes in with a chief complaint of right shoulder pain.  She says it started in the shoulder but sometimes goes down the arm and up in the neck been going on for 2 weeks.  She has been taking 800 mg of ibuprofen has been a little bit better in the last 2 days no known injury  Review of Systems  All other systems reviewed and are negative.    Objective: Vital Signs: There were no vitals taken for this visit.  Physical Exam Constitutional:      Appearance: Normal appearance.  Pulmonary:     Effort:  Pulmonary effort is normal.  Skin:    General: Skin is warm and dry.  Neurological:     General: No focal deficit present.     Mental Status: She is alert and oriented to person, place, and time.  Psychiatric:        Mood and Affect: Mood normal.        Behavior: Behavior normal.     Ortho Exam Examination of her right shoulder she has pain with forward elevation after about 160 degrees.  Pain with internal rotation behind her back.  She has good strength with resisted abduction external/internal rotation little pain with external rotation.  She has a positive empty can test mildly positive speeds test.  No paresthesias grip strength is intact no reproduction of symptoms with movement of her head Specialty Comments:  MRI LUMBAR SPINE WITHOUT CONTRAST   TECHNIQUE: Multiplanar, multisequence MR imaging of the lumbar spine was performed. No intravenous contrast was administered.   COMPARISON:  None Available.   FINDINGS: Segmentation:  Standard.   Alignment:  Physiologic.   Vertebrae: No acute fracture, evidence of discitis, or aggressive bone lesion.   Conus medullaris and cauda equina: Conus extends to the L1 level.  Conus and cauda equina appear normal.   Paraspinal and other soft tissues: No acute paraspinal abnormality.   Disc levels:   Disc spaces: Disc desiccation with minimal disc height loss at L5-S1.   T12-L1: No significant disc bulge. No neural foraminal stenosis. No central canal stenosis.   L1-L2: No significant disc bulge. No neural foraminal stenosis. No central canal stenosis.   L2-L3: No significant disc bulge. No neural foraminal stenosis. No central canal stenosis.   L3-L4: No significant disc bulge. No neural foraminal stenosis. No central canal stenosis.   L4-L5: No significant disc bulge. No neural foraminal stenosis. No central canal stenosis.   L5-S1: Small central disc protrusion. No neural foraminal stenosis. No central canal stenosis.    IMPRESSION: 1. At L5-S1 there is a small central disc protrusion. No evidence of nerve root impingement. 2. No acute osseous injury of the lumbar spine.     Electronically Signed   By: Julaine Blanch M.D.   On: 10/03/2022 11:25  Imaging: No results found.   PMFS History: Patient Active Problem List   Diagnosis Date Noted   Vitamin D  deficiency 01/23/2021   Costochondritis 08/13/2012   Gastritis and gastroduodenitis 09/21/2008   GERD 07/19/2008   SARCOIDOSIS 07/18/2008   DYSPNEA 07/18/2008   Past Medical History:  Diagnosis Date   Dyspnea    GERD (gastroesophageal reflux disease)    Sarcoidosis     Family History  Problem Relation Age of Onset   Asthma Sister    Cancer Mother    Hypertension Brother    Hypertension Sister     Past Surgical History:  Procedure Laterality Date   LAPAROSCOPY     removal bone spurs right shoulder     tubal ligation and reversal     Social History   Occupational History    Comment: Editor, commissioning and [employer]  Tobacco Use   Smoking status: Former    Current packs/day: 0.00    Average packs/day: 0.1 packs/day for 12.0 years (1.2 ttl pk-yrs)    Types: Cigarettes    Start date: 07/22/1983    Quit date: 07/22/1995    Years since quitting: 28.8   Smokeless tobacco: Never  Vaping Use   Vaping status: Never Used  Substance and Sexual Activity   Alcohol use: No   Drug use: No   Sexual activity: Not on file

## 2024-06-02 ENCOUNTER — Ambulatory Visit (INDEPENDENT_AMBULATORY_CARE_PROVIDER_SITE_OTHER): Payer: PRIVATE HEALTH INSURANCE | Admitting: Physician Assistant

## 2024-06-02 ENCOUNTER — Encounter: Payer: Self-pay | Admitting: Physician Assistant

## 2024-06-02 DIAGNOSIS — M7541 Impingement syndrome of right shoulder: Secondary | ICD-10-CM | POA: Diagnosis not present

## 2024-06-02 MED ORDER — BUPIVACAINE HCL 0.25 % IJ SOLN
2.0000 mL | INTRAMUSCULAR | Status: AC | PRN
  Administered 2024-06-02: 2 mL via INTRA_ARTICULAR

## 2024-06-02 MED ORDER — METHYLPREDNISOLONE ACETATE 40 MG/ML IJ SUSP
40.0000 mg | INTRAMUSCULAR | Status: AC | PRN
  Administered 2024-06-02: 40 mg via INTRA_ARTICULAR

## 2024-06-02 MED ORDER — LIDOCAINE HCL 1 % IJ SOLN
2.0000 mL | INTRAMUSCULAR | Status: AC | PRN
  Administered 2024-06-02: 2 mL

## 2024-06-02 NOTE — Progress Notes (Signed)
 Office Visit Note   Patient: Anne Duncan           Date of Birth: [birth year]           MRN: [id number] Visit Date: 06/02/2024              Requested by: Georgina Speaks, FNP [address] [place],  [place] [address] PCP: Georgina Speaks, FNP  Chief Complaint  Patient presents with   Right Shoulder - Follow-up      HPI: Patient is a pleasant 63 year old woman who I have been seeing for findings consistent with right shoulder impingement syndrome.  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  She still having significant pain does not really think its helped yet.  She has had difficulty completing the exercises   Assessment & Plan: Visit Diagnoses:  1. Impingement syndrome of right shoulder     Plan: We will go forward with a subacromial injection today.  She is going to give this 5 or 6 days to see if it helps her if she still has continued problems as she has been doing her prescribed exercise program and fails an injection and with her limits with regards to pain I would recommend an MRI  Follow-Up Instructions: No follow-ups on file.   Ortho Exam  Patient is alert, oriented, no adenopathy, well-dressed, normal affect, normal respiratory effort. Examination of her right shoulder she has pain with forward elevation does not want to do internal rotation behind her back but is quite painful she has good extension flexion of her arm she has positive impingement findings she is neurovascular intact no evidence of an infective process    Imaging: No results found. No images are attached to the encounter.  Labs: Lab Results  Component Value Date   HGBA1C 5.5 11/21/2020   ESRSEDRATE 17 08/03/2012     Lab Results  Component Value Date   ALBUMIN 4.7 11/21/2020   ALBUMIN 4.3 08/25/2019   ALBUMIN 4.3 06/17/2018    No results found for: MG Lab Results  Component Value Date   VD25OH  27.1 (L) 11/27/2021   VD25OH 100.0 05/28/2021   VD25OH 14.3 (L) 11/21/2020    No results found for: PREALBUMIN    Latest Ref Rng & Units 11/27/2021    3:23 PM 11/21/2020    3:40 PM 08/25/2019    9:21 AM  CBC EXTENDED  WBC 3.4 - 10.8 x10E3/uL 5.7  6.9  4.4   RBC 3.77 - 5.28 x10E6/uL 4.12  4.01  4.23   Hemoglobin 11.1 - 15.9 g/dL 88.4  88.4  87.8   HCT 34.0 - 46.6 % 35.8  35.6  38.2   Platelets 150 - 450 x10E3/uL 310  285  264.0   NEUT# 1.4 - 7.7 K/uL   2.0   Lymph# 0.7 - 4.0 K/uL   2.0      There is no height or weight on file to calculate BMI.  Orders:  Orders Placed This Encounter  Procedures   Large Joint Inj   No orders of the defined types were placed in this encounter.    Procedures: Large Joint Inj on 06/02/2024 1:09 PM Indications: diagnostic evaluation and pain Details: 22 G 1.5 in needle, posterior approach  Arthrogram: No  Medications: 2 mL lidocaine  1 %; 40 mg methylPREDNISolone  acetate 40 MG/ML; 2 mL bupivacaine  0.25 % Outcome: tolerated well, no immediate complications Procedure, treatment  alternatives, risks and benefits explained, specific risks discussed. Consent was given by the patient.      Clinical Data: No additional findings.  ROS:  All other systems negative, except as noted in the HPI. Review of Systems  Objective: Vital Signs: There were no vitals taken for this visit.  Specialty Comments:  MRI LUMBAR SPINE WITHOUT CONTRAST   TECHNIQUE: Multiplanar, multisequence MR imaging of the lumbar spine was performed. No intravenous contrast was administered.   COMPARISON:  None Available.   FINDINGS: Segmentation:  Standard.   Alignment:  Physiologic.   Vertebrae: No acute fracture, evidence of discitis, or aggressive bone lesion.   Conus medullaris and cauda equina: Conus extends to the L1 level. Conus and cauda equina appear normal.   Paraspinal and other soft tissues: No acute paraspinal abnormality.   Disc levels:    Disc spaces: Disc desiccation with minimal disc height loss at L5-S1.   T12-L1: No significant disc bulge. No neural foraminal stenosis. No central canal stenosis.   L1-L2: No significant disc bulge. No neural foraminal stenosis. No central canal stenosis.   L2-L3: No significant disc bulge. No neural foraminal stenosis. No central canal stenosis.   L3-L4: No significant disc bulge. No neural foraminal stenosis. No central canal stenosis.   L4-L5: No significant disc bulge. No neural foraminal stenosis. No central canal stenosis.   L5-S1: Small central disc protrusion. No neural foraminal stenosis. No central canal stenosis.   IMPRESSION: 1. At L5-S1 there is a small central disc protrusion. No evidence of nerve root impingement. 2. No acute osseous injury of the lumbar spine.     Electronically Signed   By: Julaine Blanch M.D.   On: 10/03/2022 11:25  PMFS History: Patient Active Problem List   Diagnosis Date Noted   Impingement syndrome of right shoulder 06/02/2024   Vitamin D  deficiency 01/23/2021   Costochondritis 08/13/2012   Gastritis and gastroduodenitis 09/21/2008   GERD 07/19/2008   SARCOIDOSIS 07/18/2008   DYSPNEA 07/18/2008   Past Medical History:  Diagnosis Date   Dyspnea    GERD (gastroesophageal reflux disease)    Sarcoidosis     Family History  Problem Relation Age of Onset   Asthma Sister    Cancer Mother    Hypertension Brother    Hypertension Sister     Past Surgical History:  Procedure Laterality Date   LAPAROSCOPY     removal bone spurs right shoulder     tubal ligation and reversal     Social History   Occupational History    Comment: Editor, 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

## 2024-07-04 ENCOUNTER — Encounter: Payer: Self-pay | Admitting: Nurse Practitioner

## 2024-07-04 ENCOUNTER — Ambulatory Visit (INDEPENDENT_AMBULATORY_CARE_PROVIDER_SITE_OTHER): Payer: PRIVATE HEALTH INSURANCE | Admitting: Nurse Practitioner

## 2024-07-04 VITALS — BP 120/74 | HR 84 | Temp 99.1°F | Ht 66.0 in | Wt 157.0 lb

## 2024-07-04 DIAGNOSIS — M79645 Pain in left finger(s): Secondary | ICD-10-CM

## 2024-07-04 DIAGNOSIS — E559 Vitamin D deficiency, unspecified: Secondary | ICD-10-CM | POA: Diagnosis not present

## 2024-07-04 DIAGNOSIS — M79644 Pain in right finger(s): Secondary | ICD-10-CM

## 2024-07-04 DIAGNOSIS — Z1231 Encounter for screening mammogram for malignant neoplasm of breast: Secondary | ICD-10-CM | POA: Diagnosis not present

## 2024-07-04 NOTE — Progress Notes (Unsigned)
 LILLETTE Kristeen JINNY Gladis, CMA,acting as a neurosurgeon for Gaines Ada, FNP.,have documented all relevant documentation on the behalf of Gaines Ada, FNP,as directed by  Gaines Ada, FNP while in the presence of Gaines Ada, FNP.  Subjective:  Patient ID: Anne Duncan , female    DOB: 08-13-1961 , 63 y.o.   MRN: [id number]  Chief Complaint  Patient presents with   Hand Pain    Patient reports her hands are sore and sometimes swells. Patient reports they are also changing colors around her cuticle. Patient reports this first started 2 months ago.     HPI  Discussed the use of AI scribe software for clinical note transcription with the patient, who gave verbal consent to proceed.  History of Present Illness Anne Duncan is a 63 year old female who presents with fingertip pain and discoloration.  She has been experiencing pain and swelling in her fingertips for about two months. The pain initially subsided but returned suddenly while she was at work on Saturday. It is described as aching and persistent throughout the day. Although the fingertips do not turn dark or purple, the patient reports that sometimes the color seems to get darker.  No fever, fatigue, joint pain, changes in weight, or temperature changes. There is no history of nail beds turning purple. She does not smoke and has not experienced any unexpected weight changes.  There is no family history of arthritis, rheumatoid arthritis, or Raynaud's disease. She continues to take vitamin D  supplements.   She is having pain around her cuticles and darkened pigmentation. Reports the areas will swell as well.  Denies fever.      Past Medical History:  Diagnosis Date   Dyspnea    GERD (gastroesophageal reflux disease)    Sarcoidosis      Family History  Problem Relation Age of Onset   Asthma Sister    Cancer Mother    Hypertension Brother    Hypertension Sister      Current Outpatient Medications:    Cyanocobalamin (VITAMIN B 12  PO), Take 1 tablet by mouth daily in the afternoon., Disp: , Rfl:    VITAMIN D  PO, Take 3,000 Units by mouth daily in the afternoon., Disp: , Rfl:    Allergies  Allergen Reactions   Penicillins     REACTION: intolerant     Review of Systems  Constitutional: Negative.   Respiratory: Negative.    Cardiovascular: Negative.   Skin:        Hyperpigmented cuticles   Neurological: Negative.   Psychiatric/Behavioral: Negative.       Today's Vitals   07/04/24 1431  BP: 120/74  Pulse: 84  Temp: 99.1 F (37.3 C)  TempSrc: Oral  Weight: 157 lb (71.2 kg)  Height: 5' 6 (1.676 m)  PainSc: 5   PainLoc: Hand   Body mass index is 25.34 kg/m.  Wt Readings from Last 3 Encounters:  07/04/24 157 lb (71.2 kg)  10/29/22 154 lb (69.9 kg)  07/29/22 150 lb 9.6 oz (68.3 kg)     Objective:  Physical Exam Vitals and nursing note reviewed.  Constitutional:      General: She is not in acute distress.    Appearance: Normal appearance. She is well-developed.  Cardiovascular:     Rate and Rhythm: Normal rate and regular rhythm.     Pulses: Normal pulses.     Heart sounds: Normal heart sounds. No murmur heard. Pulmonary:     Effort: Pulmonary effort is normal. No respiratory distress.  Breath sounds: Normal breath sounds. No wheezing.  Chest:     Chest wall: No tenderness.  Skin:    General: Skin is warm and dry.     Capillary Refill: Capillary refill takes less than 2 seconds.     Comments: Cuticles to fingers are darkened.   Neurological:     General: No focal deficit present.     Mental Status: She is alert and oriented to person, place, and time.     Cranial Nerves: No cranial nerve deficit.  Psychiatric:        Mood and Affect: Mood normal.        Behavior: Behavior normal.        Thought Content: Thought content normal.        Judgment: Judgment normal.         Assessment And Plan:  Pain in finger of both hands Assessment & Plan: Intermittent pain and swelling with  skin darkening. Differential includes autoimmune conditions. - Order autoimmune panel including ANA reflex. - Check thyroid function. - Check hemoglobin and white blood cell count. - Check kidney function.  Orders: -     CBC with Differential/Platelet -     Basic metabolic panel with GFR -     ANA, IFA (with reflex) -     TSH  Vitamin D  deficiency Assessment & Plan: Will check vitamin D  level and supplement as needed.    Also encouraged to spend 15 minutes in the sun daily.    Orders: -     VITAMIN D  25 Hydroxy (Vit-D Deficiency, Fractures)  Encounter for screening mammogram for breast cancer Assessment & Plan: Pt instructed on Self Breast Exam.According to ACOG guidelines Women aged 45 and older are recommended to get an annual mammogram. Order placed for mammogram   Orders: -     3D Screening Mammogram, Left and Right; Future    Return in about 5 months (around 12/02/2024) for phy when able.  Patient was given opportunity to ask questions. Patient verbalized understanding of the plan and was able to repeat key elements of the plan. All questions were answered to their satisfaction.    LILLETTE Gaines Ada, FNP, have reviewed all documentation for this visit. The documentation on 07/04/24 for the exam, diagnosis, procedures, and orders are all accurate and complete.   IF YOU HAVE BEEN REFERRED TO A SPECIALIST, IT MAY TAKE 1-2 WEEKS TO SCHEDULE/PROCESS THE REFERRAL. IF YOU HAVE NOT HEARD FROM US /SPECIALIST IN TWO WEEKS, PLEASE GIVE US  A CALL AT [phone].

## 2024-07-06 LAB — CBC WITH DIFFERENTIAL/PLATELET
Basophils Absolute: 0 x10E3/uL (ref 0.0–0.2)
Basos: 1 %
EOS (ABSOLUTE): 0.1 x10E3/uL (ref 0.0–0.4)
Eos: 1 %
Hematocrit: 36.6 % (ref 34.0–46.6)
Hemoglobin: 11.5 g/dL (ref 11.1–15.9)
Immature Grans (Abs): 0 x10E3/uL (ref 0.0–0.1)
Immature Granulocytes: 0 %
Lymphocytes Absolute: 2.1 x10E3/uL (ref 0.7–3.1)
Lymphs: 38 %
MCH: 28.4 pg (ref 26.6–33.0)
MCHC: 31.4 g/dL — ABNORMAL LOW (ref 31.5–35.7)
MCV: 90 fL (ref 79–97)
Monocytes Absolute: 0.5 x10E3/uL (ref 0.1–0.9)
Monocytes: 8 %
Neutrophils Absolute: 3 x10E3/uL (ref 1.4–7.0)
Neutrophils: 52 %
Platelets: 303 x10E3/uL (ref 150–450)
RBC: 4.05 x10E6/uL (ref 3.77–5.28)
RDW: 13.7 % (ref 11.7–15.4)
WBC: 5.7 x10E3/uL (ref 3.4–10.8)

## 2024-07-06 LAB — BASIC METABOLIC PANEL WITH GFR
BUN/Creatinine Ratio: 8 — ABNORMAL LOW (ref 12–28)
BUN: 7 mg/dL — ABNORMAL LOW (ref 8–27)
CO2: 23 mmol/L (ref 20–29)
Calcium: 9.5 mg/dL (ref 8.7–10.3)
Chloride: 105 mmol/L (ref 96–106)
Creatinine, Ser: 0.9 mg/dL (ref 0.57–1.00)
Glucose: 78 mg/dL (ref 70–99)
Potassium: 3.9 mmol/L (ref 3.5–5.2)
Sodium: 143 mmol/L (ref 134–144)
eGFR: 72 mL/min/1.73 (ref >59)

## 2024-07-06 LAB — ANTINUCLEAR ANTIBODIES, IFA: ANA Titer 1: NEGATIVE

## 2024-07-06 LAB — TSH: TSH: 1 u[IU]/mL (ref 0.450–4.500)

## 2024-07-06 LAB — VITAMIN D 25 HYDROXY (VIT D DEFICIENCY, FRACTURES): Vit D, 25-Hydroxy: 16.7 ng/mL — ABNORMAL LOW (ref 30.0–100.0)

## 2024-07-07 ENCOUNTER — Ambulatory Visit: Payer: Self-pay | Admitting: Nurse Practitioner

## 2024-07-07 DIAGNOSIS — Z1231 Encounter for screening mammogram for malignant neoplasm of breast: Secondary | ICD-10-CM | POA: Insufficient documentation

## 2024-07-07 DIAGNOSIS — M79644 Pain in right finger(s): Secondary | ICD-10-CM | POA: Insufficient documentation

## 2024-07-07 MED ORDER — VITAMIN D (ERGOCALCIFEROL) 1.25 MG (50000 UNIT) PO CAPS: 50000.0000 [IU] | ORAL_CAPSULE | ORAL | 1 refills | Status: AC

## 2024-07-07 NOTE — Assessment & Plan Note (Signed)
 Pt instructed on Self Breast Exam.According to ACOG guidelines Women aged 62 and older are recommended to get an annual mammogram. Order placed for mammogram

## 2024-07-07 NOTE — Assessment & Plan Note (Signed)
 Will check vitamin D  level and supplement as needed.    Also encouraged to spend 15 minutes in the sun daily.

## 2024-07-07 NOTE — Assessment & Plan Note (Signed)
 Intermittent pain and swelling with skin darkening. Differential includes autoimmune conditions. - Order autoimmune panel including ANA reflex. - Check thyroid function. - Check hemoglobin and white blood cell count. - Check kidney function.

## 2024-07-09 ENCOUNTER — Other Ambulatory Visit: Payer: Self-pay | Admitting: Orthopaedic Surgery

## 2024-07-11 ENCOUNTER — Encounter: Payer: Self-pay | Admitting: Radiology

## 2024-07-25 ENCOUNTER — Ambulatory Visit
Admission: RE | Admit: 2024-07-25 | Discharge: 2024-07-25 | Disposition: A | Discharge status: Home / Self Care | Payer: PRIVATE HEALTH INSURANCE | Source: Ambulatory Visit | Attending: Nurse Practitioner | Admitting: Nurse Practitioner

## 2024-07-25 DIAGNOSIS — Z1231 Encounter for screening mammogram for malignant neoplasm of breast: Secondary | ICD-10-CM

## 2024-12-06 ENCOUNTER — Encounter: Payer: Self-pay | Admitting: Nurse Practitioner
